# Patient Record
Sex: Female | Born: 1995 | Race: Black or African American | Hispanic: No | Marital: Single | Smoking: Never smoker
Health system: Southern US, Community
[De-identification: ages and names within clinical notes are randomized; demographics above are authoritative.]

## PROBLEM LIST (undated history)

## (undated) DIAGNOSIS — T7840XA Allergy, unspecified, initial encounter: Secondary | ICD-10-CM

## (undated) DIAGNOSIS — F32A Depression, unspecified: Secondary | ICD-10-CM

## (undated) DIAGNOSIS — F329 Major depressive disorder, single episode, unspecified: Secondary | ICD-10-CM

## (undated) DIAGNOSIS — M797 Fibromyalgia: Secondary | ICD-10-CM

## (undated) DIAGNOSIS — E282 Polycystic ovarian syndrome: Secondary | ICD-10-CM

## (undated) DIAGNOSIS — D8989 Other specified disorders involving the immune mechanism, not elsewhere classified: Secondary | ICD-10-CM

## (undated) DIAGNOSIS — F419 Anxiety disorder, unspecified: Secondary | ICD-10-CM

## (undated) HISTORY — PX: WISDOM TOOTH EXTRACTION: SHX21

## (undated) HISTORY — DX: Depression, unspecified: F32.A

## (undated) HISTORY — DX: Anxiety disorder, unspecified: F41.9

## (undated) HISTORY — DX: Major depressive disorder, single episode, unspecified: F32.9

## (undated) HISTORY — DX: Allergy, unspecified, initial encounter: T78.40XA

## (undated) HISTORY — DX: Fibromyalgia: M79.7

## (undated) MED ORDER — OZEMPIC (0.25 OR 0.5 MG/DOSE) 2 MG/3ML SC SOPN
0.2500 mg | PEN_INJECTOR | SUBCUTANEOUS | 0 refills | Status: AC
Start: 2023-09-02 — End: ?

---
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
## 2016-08-11 ENCOUNTER — Ambulatory Visit (INDEPENDENT_AMBULATORY_CARE_PROVIDER_SITE_OTHER): Payer: BLUE CROSS/BLUE SHIELD | Admitting: Family Medicine

## 2016-08-11 VITALS — BP 138/82 | HR 95 | Temp 98.1°F | Resp 16 | Ht 64.0 in | Wt 173.0 lb

## 2016-08-11 DIAGNOSIS — J069 Acute upper respiratory infection, unspecified: Secondary | ICD-10-CM

## 2016-08-11 MED ORDER — MOMETASONE FUROATE 50 MCG/ACT NA SUSP: 2.0000 | Freq: Every day | NASAL | 12 refills | Status: DC

## 2016-08-11 NOTE — Patient Instructions (Addendum)
     IF you received an x-ray today, you will receive an invoice from [facility]. Please contact [facility] at [phone] with questions or concerns regarding your invoice.   IF you received labwork today, you will receive an invoice from [facility]. Please contact [facility] at [phone] with questions or concerns regarding your invoice.   Our billing staff will not be able to assist you with questions regarding bills from these companies.  You will be contacted with the lab results as soon as they are available. The fastest way to get your results is to activate your My Chart account. Instructions are located on the last page of this paperwork. If you have not heard from us regarding the results in 2 weeks, please contact this office.      Viral Infections A virus is a type of germ. Viruses can cause:  Minor sore throats.  Aches and pains.  Headaches.  Runny nose.  Rashes.  Watery eyes.  Tiredness.  Coughs.  Loss of appetite.  Feeling sick to your stomach (nausea).  Throwing up (vomiting).  Watery poop (diarrhea). HOME CARE   Only take medicines as told by your doctor.  Drink enough water and fluids to keep your pee (urine) clear or pale yellow. Sports drinks are a good choice.  Get plenty of rest and eat healthy. Soups and broths with crackers or rice are fine. GET HELP RIGHT AWAY IF:   You have a very bad headache.  You have shortness of breath.  You have chest pain or neck pain.  You have an unusual rash.  You cannot stop throwing up.  You have watery poop that does not stop.  You cannot keep fluids down.  You or your child has a temperature by mouth above 102 F (38.9 C), not controlled by medicine.  Your baby is older than 3 months with a rectal temperature of 102 F (38.9 C) or higher.  Your baby is 253 months old or younger with a rectal temperature of 100.4 F (38 C) or higher. MAKE SURE  YOU:   Understand these instructions.  Will watch this condition.  Will get help right away if you are not doing well or get worse.   This information is not intended to replace advice given to you by your health care provider. Make sure you discuss any questions you have with your health care provider.   Document Released: 10/01/2008 Document Revised: 01/11/2012 Document Reviewed: 03/27/2015 Elsevier Interactive Patient Education [employer]2016 Elsevier Inc.

## 2016-08-11 NOTE — Progress Notes (Signed)
  Chief Complaint  Patient presents with  . Sore Throat    x 2 days   . Generalized Body Aches    HPI  Upper Respiratory Infection: Patient complains of symptoms of a URI. Symptoms include congestion, cough and sore throat. Onset of symptoms was 3 days ago, gradually worsening since that time. She also c/o achiness, post nasal drip and productive cough with  yellow colored sputum for the past 3 days .  She is not drinking much fluids. Evaluation to date: none. Treatment to date: none.  No history of asthma Non smoker    Past Medical History:  Diagnosis Date  . Allergy   . Depression     Current Outpatient Prescriptions  Medication Sig Dispense Refill  . DULoxetine HCl (CYMBALTA PO) Take by mouth.    . METFORMIN HCL PO Take by mouth.    . mometasone (NASONEX) 50 MCG/ACT nasal spray Place 2 sprays into the nose daily. 17 g 12   No current facility-administered medications for this visit.     Allergies: No Known Allergies  Past Surgical History:  Procedure Laterality Date  . tonsils and adnoids      Social History   Social History  . Marital status: Single    Spouse name: N/A  . Number of children: N/A  . Years of education: N/A   Social History Main Topics  . Smoking status: Never Smoker  . Smokeless tobacco: Never Used  . Alcohol use Yes  . Drug use: No  . Sexual activity: Not Asked   Other Topics Concern  . None   Social History Narrative  . None    ROS  Objective: Vitals:   08/11/16 1121 08/11/16 1154  BP: 112/80 138/82  Pulse: 95   Resp: 16   Temp: 98.1 F (36.7 C) 98.1 F (36.7 C)  TempSrc: Oral Oral  SpO2: 97%   Weight: 173 lb (78.5 kg)   Height: 5\' 4"  (1.626 m)     Physical Exam  General: alert, oriented, in NAD Head: normocephalic, atraumatic, no sinus tenderness Eyes: EOM intact, no scleral icterus or conjunctival injection Ears: TM clear bilaterally Throat: no pharyngeal exudate or erythema Lymph: no posterior auricular,  submental or cervical lymph adenopathy Heart: normal rate, normal sinus rhythm, no murmurs Lungs: clear to auscultation bilaterally, no wheezing   Assessment and Plan Monia was seen today for sore throat and generalized body aches.  Diagnoses and all orders for this visit:  Acute upper respiratory infection  Discussed viral vs. Bacterial infection Antibiotics not warranted at this time Supportive care Increase fluid hydration rtc if fever or worsening symptoms -     Discontinue: mometasone (NASONEX) 50 MCG/ACT nasal spray; Place 2 sprays into the nose daily. -     mometasone (NASONEX) 50 MCG/ACT nasal spray; Place 2 sprays into the nose daily.     Karrington Studnicka A Diarra Kos

## 2016-08-13 ENCOUNTER — Ambulatory Visit: Payer: BLUE CROSS/BLUE SHIELD

## 2016-08-14 ENCOUNTER — Ambulatory Visit (INDEPENDENT_AMBULATORY_CARE_PROVIDER_SITE_OTHER): Payer: BLUE CROSS/BLUE SHIELD

## 2016-08-14 ENCOUNTER — Ambulatory Visit (INDEPENDENT_AMBULATORY_CARE_PROVIDER_SITE_OTHER): Payer: BLUE CROSS/BLUE SHIELD | Admitting: Physician Assistant

## 2016-08-14 VITALS — BP 116/80 | HR 93 | Temp 97.4°F | Resp 16 | Ht 65.0 in | Wt 173.0 lb

## 2016-08-14 DIAGNOSIS — R05 Cough: Secondary | ICD-10-CM

## 2016-08-14 DIAGNOSIS — E282 Polycystic ovarian syndrome: Secondary | ICD-10-CM | POA: Insufficient documentation

## 2016-08-14 DIAGNOSIS — R059 Cough, unspecified: Secondary | ICD-10-CM

## 2016-08-14 DIAGNOSIS — R768 Other specified abnormal immunological findings in serum: Secondary | ICD-10-CM | POA: Insufficient documentation

## 2016-08-14 DIAGNOSIS — M797 Fibromyalgia: Secondary | ICD-10-CM | POA: Insufficient documentation

## 2016-08-14 LAB — POCT CBC
GRANULOCYTE PERCENT: 44.1 % (ref 37–80)
HEMATOCRIT: 38.9 % (ref 37.7–47.9)
Hemoglobin: 13.3 g/dL (ref 12.2–16.2)
Lymph, poc: 1.7 (ref 0.6–3.4)
MCH: 28.8 pg (ref 27–31.2)
MCHC: 34.1 g/dL (ref 31.8–35.4)
MCV: 84.4 fL (ref 80–97)
MID (CBC): 0.6 (ref 0–0.9)
MPV: 7.1 fL (ref 0–99.8)
PLATELET COUNT, POC: 264 10*3/uL (ref 142–424)
POC GRANULOCYTE: 1.9 — AB (ref 2–6.9)
POC LYMPH PERCENT: 40.7 %L (ref 10–50)
POC MID %: 15.2 % — AB (ref 0–12)
RBC: 4.61 M/uL (ref 4.04–5.48)
RDW, POC: 13.8 %
WBC: 4.2 10*3/uL — AB (ref 4.6–10.2)

## 2016-08-14 MED ORDER — BENZONATATE 100 MG PO CAPS: 100.0000 mg | ORAL_CAPSULE | Freq: Three times a day (TID) | ORAL | 0 refills | Status: DC | PRN

## 2016-08-14 MED ORDER — GUAIFENESIN ER 1200 MG PO TB12: 1.0000 | ORAL_TABLET | Freq: Two times a day (BID) | ORAL | 1 refills | Status: DC | PRN

## 2016-08-14 NOTE — Patient Instructions (Addendum)
You currently have a VIRAL illness. Please continue the nasal spray that Dr. Creta LevinStallings prescribed. Add the cough suppressant and the Mucinex that I sent to the pharmacy. Get LOTS of rest and LOTS of water to drink. Use ibuprofen and or acetaminophen for the body aches. We expect your symptoms to begin to improve in the next 48-72 hours. If they do not begin to improve, or if they worsen, please return for re-evaluation.    IF you received an x-ray today, you will receive an invoice from [facility]. Please contact [facility] at [phone] with questions or concerns regarding your invoice.   IF you received labwork today, you will receive an invoice from [facility]. Please contact [facility] at [phone] with questions or concerns regarding your invoice.   Our billing staff will not be able to assist you with questions regarding bills from these companies.  You will be contacted with the lab results as soon as they are available. The fastest way to get your results is to activate your My Chart account. Instructions are located on the last page of this paperwork. If you have not heard from us regarding the results in 2 weeks, please contact this office.

## 2016-08-14 NOTE — Progress Notes (Signed)
Patient ID: Catherine Hurley, female    DOB: 1996/11/02, 20 y.o.   MRN: [id number]  PCP: No PCP Per Patient  Subjective:   Chief Complaint  Patient presents with  . Follow-up    URI    HPI Presents for evaluation of persistent illness.  She was seen here on 08/11/2016 with cough, congestion and sore throat. She was diagnosed with a viral URI and prescribed supportive care.  Since her visit 10/10, she has developed chest and back pain with breathing and cough. Cough is productive of "deep yellow" colored sputum. No facial pain. Body aches. Feels cold, "and I don't get cold."   Review of Systems As above.    Patient Active Problem List   Diagnosis Date Noted  . PCOS (polycystic ovarian syndrome) 08/14/2016  . Sjogren's syndrome (HCC) 08/14/2016  . Fibromyalgia 08/14/2016  . Lupus 08/14/2016     Prior to Admission medications   Medication Sig Start Date End Date Taking? Authorizing Provider  DULoxetine HCl (CYMBALTA PO) Take by mouth.   Yes Historical Provider, MD  METFORMIN HCL PO Take by mouth.   Yes Historical Provider, MD  mometasone (NASONEX) 50 MCG/ACT nasal spray Place 2 sprays into the nose daily. 08/11/16  Yes Zoe Camelia Phenes, MD  Norethin-Eth Estradiol-Fe (GENERESS FE PO) Take by mouth.   Yes Historical Provider, MD     No Known Allergies     Objective:  Physical Exam  Constitutional: She is oriented to person, place, and time. She appears well-developed and well-nourished. She is active and cooperative. No distress.  BP 116/80 (BP Location: Right Arm, Patient Position: Sitting, Cuff Size: Normal)   Pulse 93   Temp 97.4 F (36.3 C) (Oral)   Resp 16   Ht 5\' 5"  (1.651 m)   Wt 173 lb (78.5 kg)   LMP 07/30/2016 (Approximate)   SpO2 97%   BMI 28.79 kg/m   HENT:  Head: Normocephalic and atraumatic.  Right Ear: Hearing, tympanic membrane, external ear and ear canal normal.  Left Ear: Hearing, tympanic membrane, external ear and ear canal normal.  Nose:  Mucosal edema (mild) present. No rhinorrhea.  Mouth/Throat: Uvula is midline, oropharynx is clear and moist and mucous membranes are normal. No oral lesions.  Eyes: Conjunctivae are normal. No scleral icterus.  Neck: Normal range of motion. Neck supple. No thyromegaly present.  Cardiovascular: Normal rate, regular rhythm and normal heart sounds.   Pulses:      Radial pulses are 2+ on the right side, and 2+ on the left side.  Pulmonary/Chest: Effort normal and breath sounds normal.  Lymphadenopathy:       Head (right side): No tonsillar, no preauricular, no posterior auricular and no occipital adenopathy present.       Head (left side): No tonsillar, no preauricular, no posterior auricular and no occipital adenopathy present.    She has no cervical adenopathy.       Right: No supraclavicular adenopathy present.       Left: No supraclavicular adenopathy present.  Neurological: She is alert and oriented to person, place, and time. No sensory deficit.  Skin: Skin is warm, dry and intact. No rash noted. No cyanosis or erythema. Nails show no clubbing.  Psychiatric: She has a normal mood and affect. Her speech is normal and behavior is normal.       Results for orders placed or performed in visit on 08/14/16  POCT CBC  Result Value Ref Range   WBC 4.2 (A) 4.6 -  10.2 K/uL   Lymph, poc 1.7 0.6 - 3.4   POC LYMPH PERCENT 40.7 10 - 50 %L   MID (cbc) 0.6 0 - 0.9   POC MID % 15.2 (A) 0 - 12 %M   POC Granulocyte 1.9 (A) 2 - 6.9   Granulocyte percent 44.1 37 - 80 %G   RBC 4.61 4.04 - 5.48 M/uL   Hemoglobin 13.3 12.2 - 16.2 g/dL   HCT, POC 74.238.9 59.537.7 - 47.9 %   MCV 84.4 80 - 97 fL   MCH, POC 28.8 27 - 31.2 pg   MCHC 34.1 31.8 - 35.4 g/dL   RDW, POC 63.813.8 %   Platelet Count, POC 264 142 - 424 K/uL   MPV 7.1 0 - 99.8 fL    Dg Chest 2 View  Result Date: 08/14/2016 CLINICAL DATA:  Productive cough and chills.  Initial encounter. EXAM: CHEST  2 VIEW COMPARISON:  None. FINDINGS: Lungs are  clear. Heart size is normal. No pneumothorax or pleural fluid. No bony abnormality. IMPRESSION: Negative chest. Electronically Signed   By: Drusilla Kannerhomas  Dalessio M.D.   On: 08/14/2016 11:07       Assessment & Plan:   1. Cough Reassuring CXR and CBC. Continue supportive care. Anticipatory guidance provided. - POCT CBC - DG Chest 2 View; Future - Guaifenesin (MUCINEX MAXIMUM STRENGTH) 1200 MG TB12; Take 1 tablet (1,200 mg total) by mouth every 12 (twelve) hours as needed.  Dispense: 14 tablet; Refill: 1 - benzonatate (TESSALON) 100 MG capsule; Take 1-2 capsules (100-200 mg total) by mouth 3 (three) times daily as needed for cough.  Dispense: 40 capsule; Refill: 0   Fernande Brashelle S. Evelisse Szalkowski, PA-C Physician Assistant-Certified [facility]

## 2016-08-17 ENCOUNTER — Telehealth: Payer: Self-pay

## 2016-08-17 NOTE — Telephone Encounter (Signed)
Pt requesting oow note for today,she willl return to work On Tuesday,    Best phone for pt is [phone]

## 2016-08-18 ENCOUNTER — Encounter: Payer: Self-pay | Admitting: *Deleted

## 2016-08-18 NOTE — Telephone Encounter (Signed)
Note printed and given to patient.  

## 2016-10-06 ENCOUNTER — Emergency Department (HOSPITAL_COMMUNITY)
Admission: EM | Admit: 2016-10-06 | Discharge: 2016-10-06 | Disposition: A | Discharge status: Home / Self Care | Payer: BLUE CROSS/BLUE SHIELD | Attending: Emergency Medicine | Admitting: Emergency Medicine

## 2016-10-06 ENCOUNTER — Encounter (HOSPITAL_COMMUNITY): Payer: Self-pay | Admitting: *Deleted

## 2016-10-06 DIAGNOSIS — K047 Periapical abscess without sinus: Secondary | ICD-10-CM | POA: Insufficient documentation

## 2016-10-06 DIAGNOSIS — K029 Dental caries, unspecified: Secondary | ICD-10-CM | POA: Insufficient documentation

## 2016-10-06 DIAGNOSIS — K0889 Other specified disorders of teeth and supporting structures: Secondary | ICD-10-CM | POA: Diagnosis present

## 2016-10-06 HISTORY — DX: Polycystic ovarian syndrome: E28.2

## 2016-10-06 HISTORY — DX: Other specified disorders involving the immune mechanism, not elsewhere classified: D89.89

## 2016-10-06 MED ORDER — AMOXICILLIN 500 MG PO CAPS
500.0000 mg | ORAL_CAPSULE | Freq: Once | ORAL | Status: AC
  Administered 2016-10-06: 500 mg via ORAL
  Filled 2016-10-06: qty 1

## 2016-10-06 MED ORDER — AMOXICILLIN 500 MG PO CAPS: 500.0000 mg | ORAL_CAPSULE | Freq: Three times a day (TID) | ORAL | 0 refills | Status: DC

## 2016-10-06 MED ORDER — TRAMADOL HCL 50 MG PO TABS: 50.0000 mg | ORAL_TABLET | Freq: Four times a day (QID) | ORAL | 0 refills | Status: DC | PRN

## 2016-10-06 NOTE — ED Triage Notes (Signed)
Pt c/o R sided facial and dental pain for a few weeks. Reports picking at tooth which has caused swelling to mouth and increased pain. Pt has not been able to see a dentist yet because her insurance in based out of [place] and she could not find a dentist who could see her.

## 2016-10-06 NOTE — ED Provider Notes (Signed)
[facility]-EMERGENCY DEPT Provider Note   CSN: [id number] Arrival date & time: 10/06/16  [id number]  History   Chief Complaint Chief Complaint  Patient presents with  . Dental Pain    HPI Catherine Hurley is a 20 y.o. female.  HPI   Patient with PMH of allergy, fibromyalgia, positive ANA, fibromyalgia and PCOS comes to the ER complaining of right sided dental pain and facial pain for the past few weeks. She has been picking at her tooth which has caused swelling to mouth and increased pain. She hasn't been able to see a dentist because her insurance is in [place] and she has been unable to find a dentist who will see her. She has not had nay fever, N/V/D. She has not had neck pain, headache, lethargy. She is able to swallow.   Past Medical History:  Diagnosis Date  . Allergy   . Autoimmune disorder (HCC)   . Depression   . PCOS (polycystic ovarian syndrome)     Patient Active Problem List   Diagnosis Date Noted  . PCOS (polycystic ovarian syndrome) 08/14/2016  . Fibromyalgia 08/14/2016  . Positive ANA (antinuclear antibody) 08/14/2016    Past Surgical History:  Procedure Laterality Date  . tonsils and adnoids      OB History    No data available       Home Medications    Prior to Admission medications   Medication Sig Start Date End Date Taking? Authorizing Provider  benzonatate (TESSALON) 100 MG capsule Take 1-2 capsules (100-200 mg total) by mouth 3 (three) times daily as needed for cough. 08/14/16   Chelle Jeffery, PA-C  DULoxetine HCl (CYMBALTA PO) Take by mouth.    Historical Provider, MD  Guaifenesin (MUCINEX MAXIMUM STRENGTH) 1200 MG TB12 Take 1 tablet (1,200 mg total) by mouth every 12 (twelve) hours as needed. 08/14/16   Chelle Jeffery, PA-C  METFORMIN HCL PO Take by mouth.    Historical Provider, MD  mometasone (NASONEX) 50 MCG/ACT nasal spray Place 2 sprays into the nose daily. 08/11/16   Doristine BosworthZoe A Stallings, MD  Norethin-Eth Estradiol-Fe (GENERESS FE PO) Take by mouth.     Historical Provider, MD    Family History Family History  Problem Relation Age of Onset  . Diabetes Mother   . Hypertension Mother   . Diabetes Father   . Mental illness Father   . Diabetes Maternal Grandmother   . Hyperlipidemia Maternal Grandmother   . Hypertension Maternal Grandmother   . Stroke Maternal Grandmother   . Cancer Paternal Grandmother   . Stroke Paternal Grandmother     Social History Social History  Substance Use Topics  . Smoking status: Never Smoker  . Smokeless tobacco: Never Used  . Alcohol use Yes     Allergies   Patient has no known allergies.   Review of Systems Review of Systems Review of Systems All other systems negative except as documented in the HPI. All pertinent positives and negatives as reviewed in the HPI.   Physical Exam Updated Vital Signs BP 143/95   Pulse 95   Temp 97.9 F (36.6 C) (Oral)   Resp 16   LMP 09/17/2016   SpO2 99%   Physical Exam  Constitutional: She appears well-developed and well-nourished. No distress.  HENT:  Head: Normocephalic and atraumatic.  Mouth/Throat: Uvula is midline, oropharynx is clear and moist and mucous membranes are normal. Normal dentition. Dental caries (Pts tooth shows no obvious abscess but moderate to severe tenderness to palpation of marked tooth)  present. No uvula swelling.  Eyes: Pupils are equal, round, and reactive to light.  Neck: Trachea normal, normal range of motion and full passive range of motion without pain. Neck supple.  Cardiovascular: Normal rate, regular rhythm, normal heart sounds and normal pulses.   Pulmonary/Chest: Effort normal and breath sounds normal. No respiratory distress. Chest wall is not dull to percussion. She exhibits no tenderness, no crepitus, no edema, no deformity and no retraction.  Abdominal: Normal appearance.  Musculoskeletal: Normal range of motion.  Neurological: She is alert. She has normal strength.  Skin: Skin is warm, dry and intact. She  is not diaphoretic.  Psychiatric: She has a normal mood and affect. Her speech is normal. Cognition and memory are normal.     ED Treatments / Results  Labs (all labs ordered are listed, but only abnormal results are displayed) Labs Reviewed - No data to display  EKG  EKG Interpretation None       Radiology No results found.  Procedures Procedures (including critical care time)  Medications Ordered in ED Medications  amoxicillin (AMOXIL) capsule 500 mg (not administered)     Initial Impression / Assessment and Plan / ED Course  I have reviewed the triage vital signs and the nursing notes.  Pertinent labs & imaging results that were available during my care of the patient were reviewed by me and considered in my medical decision making (see chart for details).  Clinical Course     Medications  amoxicillin (AMOXIL) capsule 500 mg (not administered)    20 y.o.Catherine Hurley's evaluation in the Emergency Department is complete.  We have discussed signs and symptoms that warrant return to the ED, such as changes or worsening in symptoms. No emergent s/sx's present. Patent airway. No trismus.  No neck tenderness or protrusion of tongue or floor of mouth. Patient will be given an rx for Amoxicillin and Ultram. He will be referred to a dentist with instructions for follow-up.  Vital signs are stable at discharge. Vitals:   10/06/16 0415 10/06/16 0455  BP: 145/93 143/95  Pulse: 95   Resp: 16   Temp: 97.9 F (36.6 C)     Patient/guardian has voiced understanding and agreed to follow-up with the PCP or specialist.   Final Clinical Impressions(s) / ED Diagnoses   Final diagnoses:  Dental infection    New Prescriptions New Prescriptions   No medications on file     Marlon Peliffany Annalysa Mohammad, PA-C 10/06/16 0544    Layla MawKristen N Ward, DO 10/06/16 [id number]

## 2016-10-08 ENCOUNTER — Encounter (HOSPITAL_COMMUNITY): Payer: Self-pay | Admitting: Emergency Medicine

## 2016-10-08 ENCOUNTER — Emergency Department (HOSPITAL_COMMUNITY)
Admission: EM | Admit: 2016-10-08 | Discharge: 2016-10-08 | Disposition: A | Discharge status: Home / Self Care | Payer: BLUE CROSS/BLUE SHIELD | Attending: Emergency Medicine | Admitting: Emergency Medicine

## 2016-10-08 DIAGNOSIS — R51 Headache: Secondary | ICD-10-CM

## 2016-10-08 DIAGNOSIS — R519 Headache, unspecified: Secondary | ICD-10-CM

## 2016-10-08 DIAGNOSIS — J329 Chronic sinusitis, unspecified: Secondary | ICD-10-CM | POA: Diagnosis not present

## 2016-10-08 DIAGNOSIS — Z7984 Long term (current) use of oral hypoglycemic drugs: Secondary | ICD-10-CM | POA: Diagnosis not present

## 2016-10-08 LAB — BASIC METABOLIC PANEL
ANION GAP: 7 (ref 5–15)
BUN: 9 mg/dL (ref 6–20)
CO2: 28 mmol/L (ref 22–32)
Calcium: 9.6 mg/dL (ref 8.9–10.3)
Chloride: 104 mmol/L (ref 101–111)
Creatinine, Ser: 0.79 mg/dL (ref 0.44–1.00)
GFR calc Af Amer: >60 mL/min (ref >60)
Glucose, Bld: 113 mg/dL — ABNORMAL HIGH (ref 65–99)
POTASSIUM: 3.7 mmol/L (ref 3.5–5.1)
SODIUM: 139 mmol/L (ref 135–145)

## 2016-10-08 LAB — CBC WITH DIFFERENTIAL/PLATELET
BASOS ABS: 0 10*3/uL (ref 0.0–0.1)
Basophils Relative: 0 %
EOS ABS: 0.1 10*3/uL (ref 0.0–0.7)
EOS PCT: 1 %
HCT: 39.8 % (ref 36.0–46.0)
Hemoglobin: 12.9 g/dL (ref 12.0–15.0)
LYMPHS PCT: 43 %
Lymphs Abs: 3 10*3/uL (ref 0.7–4.0)
MCH: 28.4 pg (ref 26.0–34.0)
MCHC: 32.4 g/dL (ref 30.0–36.0)
MCV: 87.7 fL (ref 78.0–100.0)
Monocytes Absolute: 0.6 10*3/uL (ref 0.1–1.0)
Monocytes Relative: 9 %
NEUTROS PCT: 47 %
Neutro Abs: 3.2 10*3/uL (ref 1.7–7.7)
PLATELETS: 314 10*3/uL (ref 150–400)
RBC: 4.54 MIL/uL (ref 3.87–5.11)
RDW: 13.7 % (ref 11.5–15.5)
WBC: 6.9 10*3/uL (ref 4.0–10.5)

## 2016-10-08 LAB — I-STAT BETA HCG BLOOD, ED (MC, WL, AP ONLY)

## 2016-10-08 MED ORDER — DIPHENHYDRAMINE HCL 50 MG/ML IJ SOLN
12.5000 mg | Freq: Once | INTRAMUSCULAR | Status: DC
  Filled 2016-10-08: qty 1

## 2016-10-08 MED ORDER — METOCLOPRAMIDE HCL 5 MG/ML IJ SOLN
10.0000 mg | Freq: Once | INTRAMUSCULAR | Status: DC
  Filled 2016-10-08: qty 2

## 2016-10-08 MED ORDER — FLUTICASONE PROPIONATE 50 MCG/ACT NA SUSP: 1.0000 | Freq: Every day | NASAL | 0 refills | Status: DC

## 2016-10-08 MED ORDER — IBUPROFEN 400 MG PO TABS
600.0000 mg | ORAL_TABLET | Freq: Once | ORAL | Status: AC
  Administered 2016-10-08: 600 mg via ORAL
  Filled 2016-10-08: qty 1

## 2016-10-08 NOTE — ED Triage Notes (Signed)
Pt. reports right temporal headache /pressure onset yesterday , denies head injury , no emesis or photophobia , pt. added chronic right eye twitchings for 2 years .

## 2016-10-08 NOTE — ED Provider Notes (Deleted)
20 year old female with a right temporal headache which started yesterday. Headache is similar to prior headaches. She is currently being treated for possible sinusitis with amoxicillin. There is no significant sinus tenderness on my exam. She keeps her right eye closed but this is a voluntary action in that she resists passive opening of her eyelid. She is driving herself and does not wish to receive diphenhydramine. She is discharged home with medications to treat her headache.  Medical screening examination/treatment/procedure(s) were conducted as a shared visit with non-physician practitioner(s) and myself.  I personally evaluated the patient during the encounter.     Dione Boozeavid Laurieann Friddle, MD 10/08/16 [phone]

## 2016-10-08 NOTE — ED Provider Notes (Signed)
[facility]-EMERGENCY DEPT Provider Note   CSN: [id number] Arrival date & time: 10/08/16  0106     History   Chief Complaint Chief Complaint  Patient presents with  . Headache    HPI Catherine Hurley is a 20 y.o. female.  Patient presents with complaint of right temporal headache and right maxillary sinus pressure. No fever, congestion, sore throat. She is currently on antibiotics for a tooth infection and reports the tooth is less painful. She denies visual changes, nausea or vomiting. She has not tried any medications outpatient except the antibiotic. She reports history of headaches in the past without diagnosis of migraine.   The history is provided by the patient. No language interpreter was used.    Past Medical History:  Diagnosis Date  . Allergy   . Autoimmune disorder (HCC)   . Depression   . PCOS (polycystic ovarian syndrome)     Patient Active Problem List   Diagnosis Date Noted  . PCOS (polycystic ovarian syndrome) 08/14/2016  . Fibromyalgia 08/14/2016  . Positive ANA (antinuclear antibody) 08/14/2016    Past Surgical History:  Procedure Laterality Date  . tonsils and adnoids      OB History    No data available       Home Medications    Prior to Admission medications   Medication Sig Start Date End Date Taking? Authorizing Provider  amoxicillin (AMOXIL) 500 MG capsule Take 1 capsule (500 mg total) by mouth 3 (three) times daily. 10/06/16   Tiffany Neva SeatGreene, PA-C  benzonatate (TESSALON) 100 MG capsule Take 1-2 capsules (100-200 mg total) by mouth 3 (three) times daily as needed for cough. 08/14/16   Chelle Jeffery, PA-C  DULoxetine HCl (CYMBALTA PO) Take by mouth.    Historical Provider, MD  Guaifenesin (MUCINEX MAXIMUM STRENGTH) 1200 MG TB12 Take 1 tablet (1,200 mg total) by mouth every 12 (twelve) hours as needed. 08/14/16   Chelle Jeffery, PA-C  METFORMIN HCL PO Take by mouth.    Historical Provider, MD  mometasone (NASONEX) 50 MCG/ACT nasal spray Place 2  sprays into the nose daily. 08/11/16   Doristine BosworthZoe A Stallings, MD  Norethin-Eth Estradiol-Fe (GENERESS FE PO) Take by mouth.    Historical Provider, MD  traMADol (ULTRAM) 50 MG tablet Take 1 tablet (50 mg total) by mouth every 6 (six) hours as needed. 10/06/16   Marlon Peliffany Greene, PA-C    Family History Family History  Problem Relation Age of Onset  . Diabetes Mother   . Hypertension Mother   . Diabetes Father   . Mental illness Father   . Diabetes Maternal Grandmother   . Hyperlipidemia Maternal Grandmother   . Hypertension Maternal Grandmother   . Stroke Maternal Grandmother   . Cancer Paternal Grandmother   . Stroke Paternal Grandmother     Social History Social History  Substance Use Topics  . Smoking status: Never Smoker  . Smokeless tobacco: Never Used  . Alcohol use Yes     Allergies   Patient has no known allergies.   Review of Systems Review of Systems  Constitutional: Negative for chills and fever.  HENT: Positive for sinus pain and sinus pressure. Negative for congestion, ear pain and sore throat.   Eyes: Negative for photophobia.       Complains of right eye twitching for the past 2 years.   Respiratory: Negative.  Negative for cough and shortness of breath.   Cardiovascular: Negative.  Negative for chest pain.  Gastrointestinal: Negative.  Negative for nausea.  Musculoskeletal:  Negative.  Negative for neck stiffness.  Neurological: Positive for headaches. Negative for facial asymmetry.     Physical Exam Updated Vital Signs BP 140/94   Pulse 98   Temp 98.3 F (36.8 C) (Oral)   Resp 16   Ht 5\' 4"  (1.626 m)   Wt 78.9 kg   LMP 09/17/2016   SpO2 100%   BMI 29.87 kg/m   Physical Exam  Constitutional: She is oriented to person, place, and time. She appears well-developed and well-nourished. No distress.  HENT:  Nose: Mucosal edema (Right nasal mucosal edema) present. Right sinus exhibits maxillary sinus tenderness.  Mouth/Throat: Oropharynx is clear and  moist.  Pulmonary/Chest: Effort normal. She has no wheezes. She has no rales.  Abdominal: Soft. There is no tenderness.  Musculoskeletal: Normal range of motion.  Neurological: She is alert and oriented to person, place, and time.  CN's 3-12 are grossly intact. Coordination without deficit. Speech clear and focused. Right eye lid closed asymmetrically, but there is voluntary resistance to testing.  Skin: Skin is warm and dry.  Psychiatric: She has a normal mood and affect.     ED Treatments / Results  Labs (all labs ordered are listed, but only abnormal results are displayed) Labs Reviewed  BASIC METABOLIC PANEL - Abnormal; Notable for the following:       Result Value   Glucose, Bld 113 (*)    All other components within normal limits  CBC WITH DIFFERENTIAL/PLATELET  I-STAT BETA HCG BLOOD, ED ([facility], [facility], [facility] ONLY)    EKG  EKG Interpretation None       Radiology No results found.  Procedures Procedures (including critical care time)  Medications Ordered in ED Medications - No data to display   Initial Impression / Assessment and Plan / ED Course  I have reviewed the triage vital signs and the nursing notes.  Pertinent labs & imaging results that were available during my care of the patient were reviewed by me and considered in my medical decision making (see chart for details).  Clinical Course     Patient with complaint of right sided headache without neurologic deficits. Symptoms follow sinus pattern with swelling in the right nare and distribution of pain. She is taking abx already - doubt bacterial sinusitis. Will provide symptomatic relief and encourage PCP follow up.  Final Clinical Impressions(s) / ED Diagnoses   Final diagnoses:  None   1. Sinusitis 2. Sinus headache New Prescriptions New Prescriptions   No medications on file     Danne HarborShari Topanga Alvelo, PA-C 10/08/16 0603    Dione Boozeavid Glick, MD 10/08/16 2251

## 2016-11-23 ENCOUNTER — Ambulatory Visit (INDEPENDENT_AMBULATORY_CARE_PROVIDER_SITE_OTHER): Payer: BLUE CROSS/BLUE SHIELD | Admitting: Physician Assistant

## 2016-11-23 VITALS — BP 122/74 | HR 110 | Temp 98.5°F | Resp 18 | Ht 64.0 in | Wt 176.0 lb

## 2016-11-23 DIAGNOSIS — R0989 Other specified symptoms and signs involving the circulatory and respiratory systems: Secondary | ICD-10-CM

## 2016-11-23 DIAGNOSIS — Z8709 Personal history of other diseases of the respiratory system: Secondary | ICD-10-CM | POA: Diagnosis not present

## 2016-11-23 DIAGNOSIS — J029 Acute pharyngitis, unspecified: Secondary | ICD-10-CM | POA: Diagnosis not present

## 2016-11-23 DIAGNOSIS — R05 Cough: Secondary | ICD-10-CM

## 2016-11-23 DIAGNOSIS — R059 Cough, unspecified: Secondary | ICD-10-CM

## 2016-11-23 DIAGNOSIS — J09X2 Influenza due to identified novel influenza A virus with other respiratory manifestations: Secondary | ICD-10-CM | POA: Diagnosis not present

## 2016-11-23 LAB — POCT RAPID STREP A (OFFICE): Rapid Strep A Screen: NEGATIVE

## 2016-11-23 LAB — POCT INFLUENZA A/B
Influenza A, POC: POSITIVE — AB
Influenza B, POC: NEGATIVE

## 2016-11-23 MED ORDER — ALBUTEROL SULFATE (2.5 MG/3ML) 0.083% IN NEBU
2.5000 mg | INHALATION_SOLUTION | Freq: Once | RESPIRATORY_TRACT | Status: AC
  Administered 2016-11-23: 2.5 mg via RESPIRATORY_TRACT

## 2016-11-23 MED ORDER — OSELTAMIVIR PHOSPHATE 75 MG PO CAPS
75.0000 mg | ORAL_CAPSULE | Freq: Two times a day (BID) | ORAL | 0 refills | Status: DC
Start: 2016-11-23 — End: 2017-02-11

## 2016-11-23 MED ORDER — IPRATROPIUM BROMIDE 0.02 % IN SOLN
0.5000 mg | Freq: Once | RESPIRATORY_TRACT | Status: AC
  Administered 2016-11-23: 0.5 mg via RESPIRATORY_TRACT

## 2016-11-23 NOTE — Progress Notes (Signed)
11/23/2016 2:05 PM   DOB: 05-19-1996 / MRN: [id number]  SUBJECTIVE:  Catherine Hurley is a 20 y.o. female presenting for cough that started yesterday.  Reports this started with sore throat.  Has muscle aches in her back and neck along with HA. Has not been taking any meds.  Had a low grade fever last night.   She has a history of asthma and has an inhaler and has not felt like she has needed it.    She has No Known Allergies.   She  has a past medical history of Allergy; Autoimmune disorder (HCC); Depression; and PCOS (polycystic ovarian syndrome).    She  reports that she has never smoked. She has never used smokeless tobacco. She reports that she drinks alcohol. She reports that she does not use drugs. She  has no sexual activity history on file. The patient  has a past surgical history that includes tonsils and adnoids.  Her family history includes Cancer in her paternal grandmother; Diabetes in her father, maternal grandmother, and mother; Hyperlipidemia in her maternal grandmother; Hypertension in her maternal grandmother and mother; Mental illness in her father; Stroke in her maternal grandmother and paternal grandmother.  Review of Systems  Constitutional: Positive for chills, diaphoresis and malaise/fatigue. Negative for fever.  Respiratory: Positive for cough. Negative for hemoptysis, sputum production, shortness of breath and wheezing.   Cardiovascular: Negative for chest pain.  Gastrointestinal: Negative for nausea.  Musculoskeletal: Positive for myalgias.  Skin: Negative for rash.  Neurological: Negative for dizziness and weakness.    The problem list and medications were reviewed and updated by myself where necessary and exist elsewhere in the encounter.   OBJECTIVE:  BP 122/74 (BP Location: Right Arm, Patient Position: Sitting, Cuff Size: Small)   Pulse (!) 110   Temp 98.5 F (36.9 C) (Oral)   Resp 18   Ht 5\' 4"  (1.626 m)   Wt 176 lb (79.8 kg)   LMP  (LMP Unknown)  Comment: Patient has PCOS  SpO2 95%   BMI 30.21 kg/m   Physical Exam  Constitutional: She appears well-developed and well-nourished. No distress.  HENT:  Right Ear: Tympanic membrane normal.  Left Ear: Tympanic membrane normal.  Nose: Nose normal.  Mouth/Throat: Uvula is midline, oropharynx is clear and moist and mucous membranes are normal.  Cardiovascular: Normal rate, regular rhythm and normal heart sounds.   Pulmonary/Chest: Effort normal and breath sounds normal. No respiratory distress. She has no wheezes. She has no rales. She exhibits no tenderness.  Musculoskeletal: Normal range of motion.  Skin: She is not diaphoretic.    Results for orders placed or performed in visit on 11/23/16 (from the past 72 hour(s))  POCT Influenza A/B     Status: Abnormal   Collection Time: 11/23/16 12:58 PM  Result Value Ref Range   Influenza A, POC Positive (A) Negative   Influenza B, POC Negative Negative  POCT rapid strep A     Status: Normal   Collection Time: 11/23/16 12:58 PM  Result Value Ref Range   Rapid Strep A Screen Negative Negative    No results found.  ASSESSMENT AND PLAN:  Keirstin was seen today for sinusitis, cough, sore throat and chills.  Diagnoses and all orders for this visit:  Cough:  Strict return precautions per AVS.  Tamiflu now. See problem 4.  -     POCT Influenza A/B  Influenza due to identified novel influenza A virus with other respiratory manifestations -  POCT Influenza A/B       -     oseltamivir (TAMIFLU) 75 MG capsule; Take 1 capsule (75 mg total) by mouth 2 (two)            times daily.  Abnormally low peak expiratory flow rate: 350 to 450 status post neb.  No wheezing or SOB at any time.  Advised albuterol inhaler for cough q 4-6 hours in the coming days.  Low threshold for steroids if she is not improving.  -     albuterol (PROVENTIL) (2.5 MG/3ML) 0.083% nebulizer solution 2.5 mg; Take 3 mLs (2.5 mg total) by nebulization once. -      ipratropium (ATROVENT) nebulizer solution 0.5 mg; Take 2.5 mLs (0.5 mg total) by nebulization once.  History of asthma: See problem 3.   Sore throat -     POCT rapid strep A       The patient is advised to call or return to clinic if she does not see an improvement in symptoms, or to seek the care of the closest emergency department if she worsens with the above plan.   Deliah BostonMichael Yohannes Waibel, MHS, PA-C [facility] 11/23/2016 2:05 PM

## 2016-11-23 NOTE — Patient Instructions (Addendum)
Take your first dose of tamiflu as soon as possible. Take your next dose tonight.    Drink PLENTY of fluids.   Use your albuterol every 4-6 hours as needed for cough.  Come back for a recheck in 24-48 hours.  If at any time you develop difficulty breathing then please go straight to the ED.     IF you received an x-ray today, you will receive an invoice from [facility]. Please contact [facility] at [phone] with questions or concerns regarding your invoice.   IF you received labwork today, you will receive an invoice from [facility]. Please contact [facility] at [phone] with questions or concerns regarding your invoice.   Our billing staff will not be able to assist you with questions regarding bills from these companies.  You will be contacted with the lab results as soon as they are available. The fastest way to get your results is to activate your My Chart account. Instructions are located on the last page of this paperwork. If you have not heard from us regarding the results in 2 weeks, please contact this office.

## 2016-11-24 ENCOUNTER — Ambulatory Visit: Payer: BLUE CROSS/BLUE SHIELD

## 2016-11-25 ENCOUNTER — Encounter: Payer: Self-pay | Admitting: Emergency Medicine

## 2016-11-26 ENCOUNTER — Ambulatory Visit (INDEPENDENT_AMBULATORY_CARE_PROVIDER_SITE_OTHER): Payer: BLUE CROSS/BLUE SHIELD | Admitting: Family Medicine

## 2016-11-26 ENCOUNTER — Encounter: Payer: Self-pay | Admitting: Family Medicine

## 2016-11-26 VITALS — BP 122/80 | HR 87 | Temp 97.9°F | Resp 16 | Ht 64.0 in | Wt 177.0 lb

## 2016-11-26 DIAGNOSIS — R05 Cough: Secondary | ICD-10-CM | POA: Diagnosis not present

## 2016-11-26 DIAGNOSIS — J329 Chronic sinusitis, unspecified: Secondary | ICD-10-CM

## 2016-11-26 DIAGNOSIS — J029 Acute pharyngitis, unspecified: Secondary | ICD-10-CM | POA: Diagnosis not present

## 2016-11-26 DIAGNOSIS — J111 Influenza due to unidentified influenza virus with other respiratory manifestations: Secondary | ICD-10-CM

## 2016-11-26 DIAGNOSIS — R059 Cough, unspecified: Secondary | ICD-10-CM

## 2016-11-26 LAB — POCT RAPID STREP A (OFFICE): RAPID STREP A SCREEN: NEGATIVE

## 2016-11-26 MED ORDER — HYDROCODONE-HOMATROPINE 5-1.5 MG/5ML PO SYRP: 5.0000 mL | ORAL_SOLUTION | ORAL | 0 refills | Status: DC | PRN

## 2016-11-26 MED ORDER — AZITHROMYCIN 250 MG PO TABS: ORAL_TABLET | ORAL | 0 refills | Status: DC

## 2016-11-26 MED ORDER — BENZONATATE 100 MG PO CAPS: 100.0000 mg | ORAL_CAPSULE | Freq: Three times a day (TID) | ORAL | 0 refills | Status: DC | PRN

## 2016-11-26 NOTE — Progress Notes (Signed)
Patient ID: Catherine Hurley, female    DOB: 10/29/1996  Age: 21 y.o. MRN: [id number]  Chief Complaint  Patient presents with  . Sore Throat    X 3 days     Subjective:   Patient was treated several days ago for influenza. She has continued with having a sore throat that is quite sore now. She has some postnasal drainage and nasal drainage. She is coughing some still. She is not as achy as she was. She is not running much fevers now. The throat is big concern today.  She has polycystic ovaries and is on metformin for that. She does not smoke. She is not pregnant. Current allergies, medications, problem list, past/family and social histories reviewed.  She does have a history of testing positive on some lupus test and also having a little neurologic symptom in her face. She moved here from elsewhere, and needs to be plugged in some time with various specialists. I advised her to see one of the primary care providers here at this facility in the near future, bring in whatever records she can, so that they can help refer her to other specialty care as needed.  Objective:  BP 122/80 (BP Location: Right Arm, Patient Position: Sitting, Cuff Size: Normal)   Pulse 87   Temp 97.9 F (36.6 C) (Oral)   Resp 16   Ht 5\' 4"  (1.626 m)   Wt 177 lb (80.3 kg)   LMP  (LMP Unknown) Comment: Patient has PCOS  SpO2 100%   BMI 30.38 kg/m   Throat has erythema coming down into streaks down either side of the throat. No exudate. Neck supple with small nodes. TMs are normal. Chest clear to auscultation. Heart regular without murmurs. She still is coughing.  Assessment & Plan:   Assessment: 1. Sore throat   2. Rhinosinusitis   3. Influenza   4. Cough       Plan: The original diagnosis of influenza is probably correct, but she may have either strep or a sinusitis with postnasal drainage causing the erythema and pain of the throat. Will check a strep  Screen.    Orders Placed This Encounter  Procedures   . Culture, Group A Strep    Order Specific Question:   Source    Answer:   throat  . POCT rapid strep A    Meds ordered this encounter  Medications  . azithromycin (ZITHROMAX) 250 MG tablet    Sig: Take 2 tabs PO x 1 dose, then 1 tab PO QD x 4 days    Dispense:  6 tablet    Refill:  0         Patient Instructions   Drink plenty of fluids and get enough rest  Take azithromycin 2 pills initially, then 1 daily for 4 days  Continue your other medications which were prescribed the other day.  Take the Hycodan cough syrup 1 teaspoon every 4-6 hours if needed for bad cough. However this does sedate you some, and is best taken at nighttime.  Take the Tessalon cough pills one or 2 pills 3 times daily as needed for cough. This is good for daytime cough.  I would expect you to gradually improve over the next 3 or 4 days, but some of the cough and other symptoms may linger a little longer than that even. However return if you're getting abrupt worse at anytime.  Make an appointment with one of the providers here to help you determine who you  need to be referred to for a follow-up of the possible lupus and the neurologic issues.    IF you received an x-ray today, you will receive an invoice from [facility]. Please contact [facility] at [phone] with questions or concerns regarding your invoice.   IF you received labwork today, you will receive an invoice from [facility]. Please contact [facility] at [phone] with questions or concerns regarding your invoice.   Our billing staff will not be able to assist you with questions regarding bills from these companies.  You will be contacted with the lab results as soon as they are available. The fastest way to get your results is to activate your My Chart account. Instructions are located on the last page of this paperwork. If you have not heard from [place] regarding the results in 2 weeks, please contact this office.          Return if symptoms worsen or fail to improve, for Schedule an appointment for a primary care physician/provider here.Marland Kitchen   HOPPER,DAVID, MD 11/26/2016

## 2016-11-26 NOTE — Patient Instructions (Addendum)
Drink plenty of fluids and get enough rest  Take azithromycin 2 pills initially, then 1 daily for 4 days  Continue your other medications which were prescribed the other day.  Take the Hycodan cough syrup 1 teaspoon every 4-6 hours if needed for bad cough. However this does sedate you some, and is best taken at nighttime.  Take the Tessalon cough pills one or 2 pills 3 times daily as needed for cough. This is good for daytime cough.  I would expect you to gradually improve over the next 3 or 4 days, but some of the cough and other symptoms may linger a little longer than that even. However return if you're getting abrupt worse at anytime.  Make an appointment with one of the providers here to help you determine who you need to be referred to for a follow-up of the possible lupus and the neurologic issues.    IF you received an x-ray today, you will receive an invoice from [facility]. Please contact [facility] at [phone] with questions or concerns regarding your invoice.   IF you received labwork today, you will receive an invoice from [facility]. Please contact [facility] at [phone] with questions or concerns regarding your invoice.   Our billing staff will not be able to assist you with questions regarding bills from these companies.  You will be contacted with the lab results as soon as they are available. The fastest way to get your results is to activate your My Chart account. Instructions are located on the last page of this paperwork. If you have not heard from us regarding the results in 2 weeks, please contact this office.

## 2016-11-28 LAB — CULTURE, GROUP A STREP: Strep A Culture: NEGATIVE

## 2017-01-27 ENCOUNTER — Other Ambulatory Visit: Payer: Self-pay | Admitting: Neurology

## 2017-01-27 DIAGNOSIS — G5139 Clonic hemifacial spasm, unspecified: Secondary | ICD-10-CM

## 2017-01-27 DIAGNOSIS — R51 Headache: Principal | ICD-10-CM

## 2017-01-27 DIAGNOSIS — R519 Headache, unspecified: Secondary | ICD-10-CM

## 2017-02-09 ENCOUNTER — Ambulatory Visit
Admission: RE | Admit: 2017-02-09 | Discharge: 2017-02-09 | Disposition: A | Discharge status: Home / Self Care | Payer: BLUE CROSS/BLUE SHIELD | Source: Ambulatory Visit | Attending: Neurology | Admitting: Neurology

## 2017-02-09 DIAGNOSIS — R519 Headache, unspecified: Secondary | ICD-10-CM

## 2017-02-09 DIAGNOSIS — G5139 Clonic hemifacial spasm, unspecified: Secondary | ICD-10-CM

## 2017-02-09 DIAGNOSIS — R51 Headache: Principal | ICD-10-CM

## 2017-02-09 MED ORDER — GADOBENATE DIMEGLUMINE 529 MG/ML IV SOLN
16.0000 mL | Freq: Once | INTRAVENOUS | Status: AC | PRN
  Administered 2017-02-09: 16 mL via INTRAVENOUS

## 2017-02-11 ENCOUNTER — Ambulatory Visit (INDEPENDENT_AMBULATORY_CARE_PROVIDER_SITE_OTHER): Payer: BLUE CROSS/BLUE SHIELD | Admitting: Physician Assistant

## 2017-02-11 VITALS — BP 130/86 | HR 87 | Temp 97.9°F | Resp 17 | Ht 64.0 in | Wt 183.0 lb

## 2017-02-11 DIAGNOSIS — F447 Conversion disorder with mixed symptom presentation: Secondary | ICD-10-CM | POA: Diagnosis not present

## 2017-02-11 DIAGNOSIS — H9201 Otalgia, right ear: Secondary | ICD-10-CM

## 2017-02-11 DIAGNOSIS — K029 Dental caries, unspecified: Secondary | ICD-10-CM

## 2017-02-11 DIAGNOSIS — R768 Other specified abnormal immunological findings in serum: Secondary | ICD-10-CM | POA: Diagnosis not present

## 2017-02-11 MED ORDER — AMOXICILLIN 875 MG PO TABS: 875.0000 mg | ORAL_TABLET | Freq: Two times a day (BID) | ORAL | 0 refills | Status: AC

## 2017-02-11 NOTE — Progress Notes (Signed)
Patient ID: Catherine Hurley, female    DOB: [birth year], 21 y.o.   MRN: [id number]  PCP: No PCP Per Patient  Chief Complaint  Patient presents with  . Ear Fullness    onset 3 days and before that on and off/ has an abscess tooth    Subjective:   Presents for evaluation of RIGHT ear pain and fullness x 3 days.  She has known dental caries, with a cavity on the RIGHT lower jaw, and is arranging to get a crown, but her insurance won't allow it until she has been with the plan for a certain period of time-estimates it will be June.  She also has seasonal allergies, fibromyalgia, functional neurological disorder and PCOS. She has relocated here from the [place] area and needs to establish with local specialists. She has seen me before for acute issues and desires that I be her PCP.    Review of Systems Constitutional: Negative for activity change, appetite change, fatigueand unexpected weight change.  HENT: Negative for congestion, dental problem, ear pain, hearing loss, mouth sores, postnasal drip, rhinorrhea, sneezing, sore throat, tinnitusand trouble swallowing.  Eyes: Negative for photophobia, pain, rednessand visual disturbance.  Respiratory: Negative for cough, chest tightnessand shortness of breath.  Cardiovascular: Negative for chest pain, palpitationsand leg swelling.  Gastrointestinal: Negative for abdominal pain, blood in stool, constipation, diarrhea, nauseaand vomiting.  Genitourinary: Negative for dysuria, frequency, hematuriaand urgency.  Musculoskeletal: Negative for arthralgias, gait problem, myalgiasand neck stiffness.  Skin: Negative for rash.  Neurological: Negative for dizziness, speech difficulty, weakness, light-headedness, numbnessand headaches.  Hematological: Negative for adenopathy.  Psychiatric/Behavioral: Negative for confusionand sleep disturbance. The patient is not nervous/anxious.     Patient Active Problem List   Diagnosis Date Noted    . 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 PCOS (polycystic ovarian syndrome) 08/14/2016  . Fibromyalgia 08/14/2016  . Positive ANA (antinuclear antibody) 08/14/2016     Prior to Admission medications   Medication Sig Start Date End Date Taking? Authorizing Provider  DULoxetine HCl (CYMBALTA PO) Take 30 mg by mouth daily.    Yes Historical Provider, MD  METFORMIN HCL PO Take 500 mg by mouth 3 (three) times daily.    Yes Historical Provider, MD  Norethin-Eth Estradiol-Fe (GENERESS FE PO) Take 1 tablet by mouth daily.    Yes Historical Provider, MD  amoxicillin (AMOXIL) 875 MG tablet Take 1 tablet (875 mg total) by mouth 2 (two) times daily. 02/11/17 02/21/17  Rande Roylance, PA-C     No Known Allergies     Objective:  Physical Exam  Constitutional: She is oriented to person, place, and time. She appears well-developed and well-nourished. She is active and cooperative. No distress.  BP 130/86 (BP Location: Right Arm, Cuff Size: Large)   Pulse 87   Temp 97.9 F (36.6 C) (Oral)   Resp 17   Ht  (1.626 m)   Wt 183 lb (83 kg)   LMP 12/16/2016   SpO2 99%   BMI 31.41 kg/m   HENT:  Head: Normocephalic and atraumatic.  Right Ear: Hearing, tympanic membrane, external ear and ear canal normal.  Left Ear: Hearing, tympanic membrane, external ear and ear canal normal.  Nose: Nose normal. Right sinus exhibits no maxillary sinus tenderness and no frontal sinus tenderness. Left sinus exhibits no maxillary sinus tenderness and no frontal sinus tenderness.  Mouth/Throat: Uvula is midline, oropharynx is clear and moist and mucous membranes are normal. No uvula swelling. No oropharyngeal  exudate.  Asymmetry of the lower palate, with prominence of the RIGHT floor of the mouth along the inner gingiva. Non-tender. Normo-pigmented.   Eyes: Conjunctivae are normal. No scleral icterus.  Neck: Normal range of motion. Neck supple. No thyromegaly present.   Cardiovascular: Normal rate, regular rhythm and normal heart sounds.   Pulses:      Radial pulses are 2+ on the right side, and 2+ on the left side.  Pulmonary/Chest: Effort normal and breath sounds normal.  Lymphadenopathy:       Head (right side): No tonsillar, no preauricular, no posterior auricular and no occipital adenopathy present.       Head (left side): No tonsillar, no preauricular, no posterior auricular and no occipital adenopathy present.    She has no cervical adenopathy.       Right: No supraclavicular adenopathy present.       Left: No supraclavicular adenopathy present.  Neurological: She is alert and oriented to person, place, and time. No sensory deficit.  Skin: Skin is warm, dry and intact. No rash noted. No cyanosis or erythema. Nails show no clubbing.  Psychiatric: She has a normal mood and affect. Her speech is normal and behavior is normal.           Assessment & Plan:   1. Right ear pain 2. Active dental caries While her ear looks normal, this may represent a dental infection with pain radiating to the ear. May also be due to ETD. Resume steroid nasal spray. Start an oral non-sedating antihistamine. Encouraged her to address the dental problems as soon as she can. - amoxicillin (AMOXIL) 875 MG tablet; Take 1 tablet (875 mg total) by mouth 2 (two) times daily.  Dispense: 20 tablet; Refill: 0   3. Positive ANA (antinuclear antibody) - Ambulatory referral to Rheumatology  4. Functional neurological symptom disorder with mixed symptoms - Ambulatory referral to Neurology    Return if symptoms worsen or fail to improve.   Fernande Bras, PA-C [facility]

## 2017-02-11 NOTE — Progress Notes (Signed)
THIS NOTE IS USED FOR EDUCATIONAL PURPOSES ONLY!!!   Name: Prisma Decarlo  DOB: Oct 07, 1996  Age: 21 y.o. Sex: female  CC:  Chief Complaint  Patient presents with  . Ear Fullness    PCP: No PCP Per Patient  HPI: Patient reports today with complains of ear fullness & ear pain x3 days.   Patient reports that the pain was on and off but is not worsening and is constant. Denies ringing in the ears and changes in hearing. Patient denies pain when she pulls on the ear. No trauma to the ear. Denies change in vision. Reports a history of seasonal allergies. Denies ear drainage.   Denies recent illness. Denies fevers chills. Reports chronic HA.   Patient reports a history of needing a root canal but has not been able to get it taken care of because she has to wait 6 months for her new insurance to kick in. Patient reports she is hoping to get her tooth taken care of prior to June of 2018.    ROS:  Constitutional: Negative for activity change, appetite change, fatigue and unexpected weight change.  HENT: Negative for congestion, dental problem, ear pain, hearing loss, mouth sores, postnasal drip, rhinorrhea, sneezing, sore throat, tinnitus and trouble swallowing.   Eyes: Negative for photophobia, pain, redness and visual disturbance.  Respiratory: Negative for cough, chest tightness and shortness of breath.   Cardiovascular: Negative for chest pain, palpitations and leg swelling.  Gastrointestinal: Negative for abdominal pain, blood in stool, constipation, diarrhea, nausea and vomiting.  Genitourinary: Negative for dysuria, frequency, hematuria and urgency.  Musculoskeletal: Negative for arthralgias, gait problem, myalgias and neck stiffness.  Skin: Negative for rash.  Neurological: Negative for dizziness, speech difficulty, weakness, light-headedness, numbness and headaches.  Hematological: Negative for adenopathy.  Psychiatric/Behavioral: Negative for confusion and sleep disturbance. The patient  is not nervous/anxious.    PMH:  Patient Active Problem List   Diagnosis Date Noted  . PCOS (polycystic ovarian syndrome) 08/14/2016  . Fibromyalgia 08/14/2016  . Positive ANA (antinuclear antibody) 08/14/2016    Allergies: No Known Allergies  Medications:  Current Outpatient Prescriptions on File Prior to Visit  Medication Sig Dispense Refill  . azithromycin (ZITHROMAX) 250 MG tablet Take 2 tabs PO x 1 dose, then 1 tab PO QD x 4 days 6 tablet 0  . benzonatate (TESSALON) 100 MG capsule Take 1-2 capsules (100-200 mg total) by mouth 3 (three) times daily as needed for cough. 40 capsule 0  . DULoxetine HCl (CYMBALTA PO) Take 30 mg by mouth daily.     . fluticasone (FLONASE) 50 MCG/ACT nasal spray Place 1 spray into both nostrils daily. 16 g 0  . HYDROcodone-homatropine (HYCODAN) 5-1.5 MG/5ML syrup Take 5 mLs by mouth every 4 (four) hours as needed. 120 mL 0  . ibuprofen (ADVIL,MOTRIN) 800 MG tablet Take 800 mg by mouth every 8 (eight) hours as needed for moderate pain.    Marland Kitchen METFORMIN HCL PO Take 500 mg by mouth 3 (three) times daily.     . mometasone (NASONEX) 50 MCG/ACT nasal spray Place 2 sprays into the nose daily. (Patient taking differently: Place 2 sprays into the nose daily as needed (allergies). ) 17 g 12  . Norethin-Eth Estradiol-Fe (GENERESS FE PO) Take 1 tablet by mouth daily.     Marland Kitchen oseltamivir (TAMIFLU) 75 MG capsule Take 1 capsule (75 mg total) by mouth 2 (two) times daily. 10 capsule 0  . traMADol (ULTRAM) 50 MG tablet Take 1 tablet (50  mg total) by mouth every 6 (six) hours as needed. (Patient taking differently: Take 50 mg by mouth every 6 (six) hours as needed for moderate pain. ) 10 tablet 0   No current facility-administered medications on file prior to visit.     PE:  GS: WDWN female sitting on exam table in NAD.  Vitals: BP (!) 145/88 (BP Location: Right Arm, Patient Position: Sitting, Cuff Size: Normal)   Pulse 87   Temp 97.9 F (36.6 C) (Oral)   Resp 17   Ht   (1.626 m)   Wt 183 lb (83 kg)   SpO2 99%   BMI 31.41 kg/m  HEENT: Normocephalic, atruamatic. PEARRL. No cervical lymphadenopathy. No thyroid nodules, normal size, and equal bilaterally. Ears: Bilateral ears without erythema, TM clear with good coen of light. TM without bulging. Nose: Patent, without erythema or discharge. Mouth/Throat: Moist mucous membranes. No erythema. 4cm and it's longest diameter hard, non-ttp, non-erythematous, non-moveable nodule in the right bottom palate.  Tonsils without erythema or tonsillar exudate.  Cardiovascular: RRR. No S3 or S4. No murmurs, rubs, or gallops. Pulses 2+ and equal bilateral in the upper and lower extremities. No pitting edema. No varicosities, clubbing, or cyanosis.  Pulm: CTA bilaterally. No expiratory muscle use while breathing.  GI: +BS. NTND. No rigidity or guarding. No rebound tenderness.  Neuro: CN 2-12 grossly intact.  Psych: A&O x 4. Mood and affect appropriate for situation.  Skin: Warm and dry. No rashes or excoriations on exposed skin.   A&P:        Respectfully,  Camillia Herter, PA-S2

## 2017-02-11 NOTE — Patient Instructions (Addendum)
Restart the steroid nasal spray. Get at least 64 ounces of water a day. Add an oral antihistamine (like Claritin, Allegra or Zyrtec).    IF you received an x-ray today, you will receive an invoice from [facility]. Please contact [facility] at [phone] with questions or concerns regarding your invoice.   IF you received labwork today, you will receive an invoice from [facility]. Please contact [facility] at [phone] with questions or concerns regarding your invoice.   Our billing staff will not be able to assist you with questions regarding bills from these companies.  You will be contacted with the lab results as soon as they are available. The fastest way to get your results is to activate your My Chart account. Instructions are located on the last page of this paperwork. If you have not heard from [place] regarding the results in 2 weeks, please contact this office.

## 2017-02-16 ENCOUNTER — Encounter: Payer: Self-pay | Admitting: Neurology

## 2017-04-12 NOTE — Progress Notes (Deleted)
   Office Visit Note  Patient: Catherine Hurley             Date of Birth: 05/02/1996           MRN: [id number]             PCP: Patient, No Pcp Per Referring: Porfirio OarJeffery, Chelle, PA-C Visit Date: 04/15/2017 Occupation: @GUAROCC @    Subjective:  No chief complaint on file.   History of Present Illness: Catherine Hurley is a 21 y.o. female ***   Activities of Daily Living:  Patient reports morning stiffness for *** {minute/hour:19697}.   Patient {ACTIONS;DENIES/REPORTS:21021675::"Denies"} nocturnal pain.  Difficulty dressing/grooming: {ACTIONS;DENIES/REPORTS:21021675::"Denies"} Difficulty climbing stairs: {ACTIONS;DENIES/REPORTS:21021675::"Denies"} Difficulty getting out of chair: {ACTIONS;DENIES/REPORTS:21021675::"Denies"} Difficulty using hands for taps, buttons, cutlery, and/or writing: {ACTIONS;DENIES/REPORTS:21021675::"Denies"}   No Rheumatology ROS completed.   PMFS History:  Patient Active Problem List   Diagnosis Date Noted  . 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 PCOS (polycystic ovarian syndrome) 08/14/2016  . Fibromyalgia 08/14/2016  . Positive ANA (antinuclear antibody) 08/14/2016    Past Medical History:  Diagnosis Date  . Allergy   . Autoimmune disorder (HCC)   . Depression   . PCOS (polycystic ovarian syndrome)     Family History  Problem Relation Age of Onset  . Diabetes Mother   . Hypertension Mother   . Diabetes Father   . Mental illness Father   . Diabetes Maternal Grandmother   . Hyperlipidemia Maternal Grandmother   . Hypertension Maternal Grandmother   . Stroke Maternal Grandmother   . Cancer Paternal Grandmother   . Stroke Paternal Grandmother    Past Surgical History:  Procedure Laterality Date  . 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     Objective: Vital Signs: There were no vitals taken for this visit.   Physical Exam    Musculoskeletal Exam: ***  CDAI Exam: No CDAI exam completed.    Investigation: No additional findings.   Imaging: No results found.  Speciality Comments: No specialty comments available.    Procedures:  No procedures performed Allergies: Patient has no known allergies.   Assessment / Plan:     Visit Diagnoses: Positive ANA (antinuclear antibody)  Functional neurological symptom disorder with mixed symptoms  PCOS (polycystic ovarian syndrome)  History of fibromyalgia    Orders: No orders of the defined types were placed in this encounter.  No orders of the defined types were placed in this encounter.   Face-to-face time spent with patient was *** minutes. 50% of time was spent in counseling and coordination of care.  Follow-Up Instructions: No Follow-up on file.   Pollyann SavoyShaili Manaia Samad, MD  Note - This record has been created using Animal nutritionistDragon software.  Chart creation errors have been sought, but may not always  have been located. Such creation errors do not reflect on  the standard of medical care.

## 2017-04-15 ENCOUNTER — Ambulatory Visit: Payer: BLUE CROSS/BLUE SHIELD | Admitting: Rheumatology

## 2017-04-27 ENCOUNTER — Encounter (HOSPITAL_COMMUNITY): Payer: Self-pay | Admitting: Emergency Medicine

## 2017-04-27 ENCOUNTER — Ambulatory Visit (HOSPITAL_COMMUNITY)
Admission: EM | Admit: 2017-04-27 | Discharge: 2017-04-27 | Disposition: A | Discharge status: Home / Self Care | Payer: BLUE CROSS/BLUE SHIELD | Attending: Family Medicine | Admitting: Family Medicine

## 2017-04-27 ENCOUNTER — Ambulatory Visit (INDEPENDENT_AMBULATORY_CARE_PROVIDER_SITE_OTHER): Payer: BLUE CROSS/BLUE SHIELD

## 2017-04-27 ENCOUNTER — Ambulatory Visit (INDEPENDENT_AMBULATORY_CARE_PROVIDER_SITE_OTHER): Payer: BLUE CROSS/BLUE SHIELD | Admitting: Physician Assistant

## 2017-04-27 ENCOUNTER — Ambulatory Visit: Payer: BLUE CROSS/BLUE SHIELD | Admitting: Neurology

## 2017-04-27 DIAGNOSIS — Z79899 Other long term (current) drug therapy: Secondary | ICD-10-CM | POA: Diagnosis not present

## 2017-04-27 DIAGNOSIS — N008 Acute nephritic syndrome with other morphologic changes: Secondary | ICD-10-CM | POA: Diagnosis not present

## 2017-04-27 DIAGNOSIS — R109 Unspecified abdominal pain: Secondary | ICD-10-CM | POA: Diagnosis not present

## 2017-04-27 DIAGNOSIS — N760 Acute vaginitis: Secondary | ICD-10-CM

## 2017-04-27 DIAGNOSIS — Z8249 Family history of ischemic heart disease and other diseases of the circulatory system: Secondary | ICD-10-CM | POA: Insufficient documentation

## 2017-04-27 DIAGNOSIS — R300 Dysuria: Secondary | ICD-10-CM | POA: Insufficient documentation

## 2017-04-27 DIAGNOSIS — F329 Major depressive disorder, single episode, unspecified: Secondary | ICD-10-CM | POA: Insufficient documentation

## 2017-04-27 DIAGNOSIS — E282 Polycystic ovarian syndrome: Secondary | ICD-10-CM | POA: Insufficient documentation

## 2017-04-27 DIAGNOSIS — N059 Unspecified nephritic syndrome with unspecified morphologic changes: Secondary | ICD-10-CM | POA: Diagnosis not present

## 2017-04-27 DIAGNOSIS — B9689 Other specified bacterial agents as the cause of diseases classified elsewhere: Secondary | ICD-10-CM | POA: Diagnosis not present

## 2017-04-27 DIAGNOSIS — M545 Low back pain: Secondary | ICD-10-CM

## 2017-04-27 DIAGNOSIS — Z3202 Encounter for pregnancy test, result negative: Secondary | ICD-10-CM

## 2017-04-27 DIAGNOSIS — M549 Dorsalgia, unspecified: Secondary | ICD-10-CM | POA: Insufficient documentation

## 2017-04-27 DIAGNOSIS — Z823 Family history of stroke: Secondary | ICD-10-CM | POA: Diagnosis not present

## 2017-04-27 DIAGNOSIS — Z833 Family history of diabetes mellitus: Secondary | ICD-10-CM | POA: Diagnosis not present

## 2017-04-27 DIAGNOSIS — Z809 Family history of malignant neoplasm, unspecified: Secondary | ICD-10-CM | POA: Diagnosis not present

## 2017-04-27 LAB — CBC WITH DIFFERENTIAL/PLATELET
Basophils Absolute: 0 10*3/uL (ref 0.0–0.1)
Basophils Relative: 0 %
Eosinophils Absolute: 0.1 10*3/uL (ref 0.0–0.7)
Eosinophils Relative: 1 %
HCT: 42.4 % (ref 36.0–46.0)
HEMOGLOBIN: 13.5 g/dL (ref 12.0–15.0)
LYMPHS ABS: 2.9 10*3/uL (ref 0.7–4.0)
LYMPHS PCT: 40 %
MCH: 28.3 pg (ref 26.0–34.0)
MCHC: 31.8 g/dL (ref 30.0–36.0)
MCV: 88.9 fL (ref 78.0–100.0)
Monocytes Absolute: 0.6 10*3/uL (ref 0.1–1.0)
Monocytes Relative: 8 %
NEUTROS PCT: 51 %
Neutro Abs: 3.7 10*3/uL (ref 1.7–7.7)
Platelets: 324 10*3/uL (ref 150–400)
RBC: 4.77 MIL/uL (ref 3.87–5.11)
RDW: 13.2 % (ref 11.5–15.5)
WBC: 7.3 10*3/uL (ref 4.0–10.5)

## 2017-04-27 LAB — POCT I-STAT, CHEM 8
BUN: 7 mg/dL (ref 6–20)
CALCIUM ION: 1.17 mmol/L (ref 1.15–1.40)
CHLORIDE: 103 mmol/L (ref 101–111)
CREATININE: 0.7 mg/dL (ref 0.44–1.00)
GLUCOSE: 99 mg/dL (ref 65–99)
HCT: 43 % (ref 36.0–46.0)
Hemoglobin: 14.6 g/dL (ref 12.0–15.0)
Potassium: 3.7 mmol/L (ref 3.5–5.1)
Sodium: 140 mmol/L (ref 135–145)
TCO2: 25 mmol/L (ref 0–100)

## 2017-04-27 LAB — POCT URINALYSIS DIP (DEVICE)
Bilirubin Urine: NEGATIVE
GLUCOSE, UA: NEGATIVE mg/dL
KETONES UR: NEGATIVE mg/dL
LEUKOCYTES UA: NEGATIVE
Nitrite: NEGATIVE
PROTEIN: 30 mg/dL — AB
Urobilinogen, UA: 0.2 mg/dL (ref 0.0–1.0)
pH: 5.5 (ref 5.0–8.0)

## 2017-04-27 LAB — POCT PREGNANCY, URINE: Preg Test, Ur: NEGATIVE

## 2017-04-27 MED ORDER — ONDANSETRON 4 MG PO TBDP: 4.0000 mg | ORAL_TABLET | Freq: Three times a day (TID) | ORAL | 0 refills | Status: DC | PRN

## 2017-04-27 MED ORDER — CEFTRIAXONE SODIUM 1 G IJ SOLR
1.0000 g | Freq: Once | INTRAMUSCULAR | Status: AC
  Administered 2017-04-27: 1 g via INTRAMUSCULAR

## 2017-04-27 MED ORDER — CEFTRIAXONE SODIUM 1 G IJ SOLR
INTRAMUSCULAR | Status: AC
  Filled 2017-04-27: qty 10

## 2017-04-27 MED ORDER — LIDOCAINE HCL (PF) 1 % IJ SOLN
INTRAMUSCULAR | Status: AC
  Filled 2017-04-27: qty 2

## 2017-04-27 MED ORDER — FLUCONAZOLE 200 MG PO TABS: ORAL_TABLET | ORAL | 0 refills | Status: DC

## 2017-04-27 MED ORDER — CEPHALEXIN 500 MG PO CAPS: 500.0000 mg | ORAL_CAPSULE | Freq: Four times a day (QID) | ORAL | 0 refills | Status: DC

## 2017-04-27 MED ORDER — METRONIDAZOLE 500 MG PO TABS: 500.0000 mg | ORAL_TABLET | Freq: Two times a day (BID) | ORAL | 0 refills | Status: DC

## 2017-04-27 NOTE — Discharge Instructions (Signed)
For your vaginal discharge, we prescribed metronidazole here in the clinic, take one tablet twice a day for 7 days. Do not drink any alcohol while taking.  Based on your signs, and symptoms, I am concerned about the possibility of a kidney infection, we have given injection of ceftriaxone here, given some Zofran for nausea, and Keflex. Take one tablet 4 times a day for 5 days.  I've also provided a Diflucan. Do not take unless you show signs or symptoms of a yeast infection. If you do, take one tablet wait 3 days and then take the second tablet

## 2017-04-27 NOTE — ED Provider Notes (Signed)
CSN: [id number]     Arrival date & time 04/27/17  1726 History   First MD Initiated Contact with Patient 04/27/17 1742     Chief Complaint  Patient presents with  . Back Hurley  . Dysuria   (Consider location/radiation/quality/duration/timing/severity/associated sxs/prior Treatment) Catherine Hurley is a 22 y.o. female with a past history of PCOS, who presents to the [facility] urgent care with a chief complaint of back Hurley for 2 weeks, discolored urine, frequency, and urgency along with dysuria. Catherine Hurley, does have vaginal discharge described as thin and white. She is sexually active, does not use protection, she is on birth control. She works as a Water quality scientist at [employer], states she has increased her intake of soda/caffiene containing drinks to stay awake and believes this is also causing discomfort.     The history is provided by the patient.  Back Hurley  Associated symptoms: dysuria   Associated symptoms: no abdominal Hurley and no pelvic Hurley   Dysuria  Associated symptoms: flank Hurley and vaginal discharge   Associated symptoms: no abdominal Hurley, no nausea and no vomiting     Past Medical History:  Diagnosis Date  . Allergy   . Autoimmune disorder (HCC)   . Depression   . PCOS (polycystic ovarian syndrome)    Past Surgical History:  Procedure Laterality Date  . tonsils and adnoids     Family History  Problem Relation Age of Onset  . Diabetes Mother   . Hypertension Mother   . Diabetes Father   . Mental illness Father   . Diabetes Maternal Grandmother   . Hyperlipidemia Maternal Grandmother   . Hypertension Maternal Grandmother   . Stroke Maternal Grandmother   . Cancer Paternal Grandmother   . Stroke Paternal Grandmother    Social History  Substance Use Topics  . Smoking status: Never Smoker  . Smokeless tobacco: Never Used  . Alcohol use Yes   OB History    No data available     Review of Systems  Constitutional: Negative.   HENT:  Negative.   Respiratory: Negative.   Cardiovascular: Negative.   Gastrointestinal: Negative for abdominal Hurley, diarrhea, nausea and vomiting.  Genitourinary: Positive for dysuria, flank Hurley, frequency, urgency and vaginal discharge. Negative for menstrual problem, pelvic Hurley, vaginal bleeding and vaginal Hurley.  Musculoskeletal: Positive for back Hurley.  Skin: Negative.   Neurological: Negative.     Allergies  Patient has no known allergies.  Home Medications   Prior to Admission medications   Medication Sig Start Date End Date Taking? Authorizing Provider  Norethin-Eth Estradiol-Fe (GENERESS FE PO) Take 1 tablet by mouth daily.    Yes [provider]  cephALEXin (KEFLEX) 500 MG capsule Take 1 capsule (500 mg total) by mouth 4 (four) times daily. 04/27/17   Dorena Bodo, NP  DULoxetine HCl (CYMBALTA PO) Take 30 mg by mouth daily.     [provider]  fluconazole (DIFLUCAN) 200 MG tablet Take one tablet today, wait 3 days, take the second tablet 04/27/17   Dorena Bodo, NP  METFORMIN HCL PO Take 500 mg by mouth 3 (three) times daily.     [provider]  metroNIDAZOLE (FLAGYL) 500 MG tablet Take 1 tablet (500 mg total) by mouth 2 (two) times daily. 04/27/17   Dorena Bodo, NP  ondansetron (ZOFRAN ODT) 4 MG disintegrating tablet Take 1 tablet (4 mg total) by mouth every 8 (eight) hours as needed for nausea or vomiting. 04/27/17   Loretta Plume,  Lyman BishopLawrence, NP   Meds Ordered and Administered this Visit   Medications  cefTRIAXone (ROCEPHIN) injection 1 g (1 g Intramuscular Given 04/27/17 1838)    BP 130/87 (BP Location: Right Arm)   Pulse 90   Temp 97.9 F (36.6 C) (Oral)   Resp 18   SpO2 100%  No data found.   Physical Exam  Constitutional: She appears well-developed. No distress.  HENT:  Head: Normocephalic.  Right Ear: External ear normal.  Left Ear: External ear normal.  Eyes: Conjunctivae are normal.  Neck: Normal range of motion.   Cardiovascular: Normal rate and regular rhythm.   Pulmonary/Chest: Effort normal and breath sounds normal.  Abdominal: Bowel sounds are normal. There is CVA tenderness (left sided).  Genitourinary:  Genitourinary Comments: Urine cytology obtained  Skin: Skin is warm and dry. Capillary refill takes less than 2 seconds. She is not diaphoretic.  Psychiatric: She has a normal mood and affect. Her behavior is normal.  Nursing note and vitals reviewed.   Urgent Care Course     Procedures (including critical care time)  Labs Review Labs Reviewed  POCT URINALYSIS DIP (DEVICE) - Abnormal; Notable for the following:       Result Value   Hgb urine dipstick MODERATE (*)    Protein, ur 30 (*)    All other components within normal limits  URINE CULTURE  CBC WITH DIFFERENTIAL/PLATELET  POCT PREGNANCY, URINE  POCT I-STAT, CHEM 8  URINE CYTOLOGY ANCILLARY ONLY    Imaging Review Dg Abdomen 1 View  Result Date: 04/27/2017 CLINICAL DATA:  Abdominal Hurley for 3 weeks EXAM: ABDOMEN - 1 VIEW COMPARISON:  None. FINDINGS: Nonobstructed bowel-gas pattern with moderate to large amount of stool in the colon. No abnormal calcifications. IMPRESSION: Nonobstructed bowel-gas pattern with moderate to large amount of stool in the colon. Electronically Signed   By: Jasmine PangKim  Fujinaga M.D.   On: 04/27/2017 18:28        MDM   1. Bacterial vaginosis   2. Nephritis     Catherine Hurley is a 21 y.o. female here with urinary urgency, frequency, and dysuria along with vaginal discharge.  Differential diagnosis for urinary symptoms includes, but is not limited to, UTI, pyelonephritis, ureterallithiasis, STI, reaction to hygiene products, and others.  VSS. Exam without any abdominal tenderness but does have left sided CVAT. Pt is afebrile & has not been vomiting. UA is significant for HgB, otherwise not particularly sugestive of UTI. KUB was obtained and negative for renal stone.   Will treat with Rocephin here and  keflex along with Zofran. UC sent. Follow up with PCP in 3-5 days for recheck as needed.   Urine cytology also obtained, treating presumptively for bacterial vaginitis, started on metronidazole. Also, patient gets yeast infections with antibiotics, given paper prescription for Diflucan, and instructions on its use if needed.  If symptoms worsen, develop back Hurley/fever/vomiting return to the urgent care, or go to the ER for further treatment.      Dorena BodoKennard, Rosina Cressler, NP 04/27/17 1926

## 2017-04-27 NOTE — ED Triage Notes (Signed)
[facility] a [facility] of lower back pain and dysuria and dark colored urine x 2 weeks.

## 2017-04-28 ENCOUNTER — Ambulatory Visit: Payer: BLUE CROSS/BLUE SHIELD | Admitting: Physician Assistant

## 2017-04-28 LAB — URINE CULTURE

## 2017-04-28 LAB — URINE CYTOLOGY ANCILLARY ONLY
CHLAMYDIA, DNA PROBE: NEGATIVE
Neisseria Gonorrhea: NEGATIVE
TRICH (WINDOWPATH): NEGATIVE

## 2017-04-30 LAB — CERVICOVAGINAL ANCILLARY ONLY: CANDIDA VAGINITIS: NEGATIVE

## 2017-05-14 ENCOUNTER — Ambulatory Visit: Payer: BLUE CROSS/BLUE SHIELD | Admitting: Rheumatology

## 2017-09-30 ENCOUNTER — Ambulatory Visit: Payer: BLUE CROSS/BLUE SHIELD | Admitting: Physician Assistant

## 2017-11-23 MED FILL — AMOXICILLIN 500 MG CAPSULE: 500 | 7 days supply | Qty: 21 | Fill #0

## 2017-12-01 ENCOUNTER — Ambulatory Visit: Payer: BLUE CROSS/BLUE SHIELD | Admitting: Women's Health

## 2017-12-01 DIAGNOSIS — Z0289 Encounter for other administrative examinations: Secondary | ICD-10-CM

## 2017-12-01 MED FILL — ACETAMINOPHEN/COD #3 TABLET: 300-30 | 2 days supply | Qty: 9 | Fill #0

## 2017-12-01 MED FILL — IBUPROFEN 400 MG TABS: 400 | 5 days supply | Qty: 30 | Fill #0

## 2017-12-01 MED FILL — CHLORHEXIDINE 0.12% RINSE: 0.12 | 24 days supply | Qty: 473 | Fill #0

## 2017-12-14 MED FILL — NORETHINDRONE 0.35 MG TAB: 0.35 | 28 days supply | Qty: 28 | Fill #0

## 2017-12-14 MED FILL — SPIRONOLACTONE 100 MG TABS: 100 | 30 days supply | Qty: 30 | Fill #0

## 2018-03-14 MED FILL — ACETAMINOPHEN/COD #3 TABLET: 300-30 | 2 days supply | Qty: 12 | Fill #0

## 2018-03-22 ENCOUNTER — Ambulatory Visit (INDEPENDENT_AMBULATORY_CARE_PROVIDER_SITE_OTHER): Payer: No Typology Code available for payment source | Admitting: Urgent Care

## 2018-03-22 ENCOUNTER — Encounter: Payer: Self-pay | Admitting: Urgent Care

## 2018-03-22 VITALS — BP 130/80 | HR 112 | Temp 98.0°F | Resp 16 | Ht 64.0 in | Wt 182.2 lb

## 2018-03-22 DIAGNOSIS — G44209 Tension-type headache, unspecified, not intractable: Secondary | ICD-10-CM

## 2018-03-22 DIAGNOSIS — J029 Acute pharyngitis, unspecified: Secondary | ICD-10-CM | POA: Diagnosis not present

## 2018-03-22 DIAGNOSIS — R591 Generalized enlarged lymph nodes: Secondary | ICD-10-CM

## 2018-03-22 DIAGNOSIS — R0989 Other specified symptoms and signs involving the circulatory and respiratory systems: Secondary | ICD-10-CM

## 2018-03-22 DIAGNOSIS — R05 Cough: Secondary | ICD-10-CM | POA: Diagnosis not present

## 2018-03-22 DIAGNOSIS — R059 Cough, unspecified: Secondary | ICD-10-CM

## 2018-03-22 DIAGNOSIS — R070 Pain in throat: Secondary | ICD-10-CM

## 2018-03-22 DIAGNOSIS — Z9109 Other allergy status, other than to drugs and biological substances: Secondary | ICD-10-CM

## 2018-03-22 LAB — POCT RAPID STREP A (OFFICE): RAPID STREP A SCREEN: NEGATIVE

## 2018-03-22 MED ORDER — HYDROCODONE-HOMATROPINE 5-1.5 MG/5ML PO SYRP: 5.0000 mL | ORAL_SOLUTION | Freq: Every evening | ORAL | 0 refills | Status: DC | PRN

## 2018-03-22 NOTE — Progress Notes (Signed)
    MRN: [id number] DOB: November 28, 1995  Subjective:   Catherine Hurley is a 22 y.o. female presenting for 3 day history of worsening sore throat, lymph node pain, subjective fever, dry cough. Has tried 2 nasal sprays, Atrovent and mometasone, cough drops, Tylenol #3 (from having a tooth pulled in January 2019). Has a history of tonsillectomy at 22 y/o. Denies sinus congestion, runny nose, ear pain, sinus pain, chest pain, shob, wheezing, n/v, abdominal pain, rash.  Denies smoking cigarettes.  Catherine Hurley has a current medication list which includes the following prescription(s): acetaminophen-codeine and norethindrone. Also has No Known Allergies.  Catherine Hurley  has a past medical history of Allergy, Autoimmune disorder (HCC), Depression, and PCOS (polycystic ovarian syndrome). Also  has a past surgical history that includes tonsils and adnoids.  Objective:   Vitals: BP 130/80   Pulse (!) 112   Temp 98 F (36.7 C) (Oral)   Resp 16   Ht  (1.626 m)   Wt 182 lb 3.2 oz (82.6 kg)   SpO2 98%   BMI 31.27 kg/m   Physical Exam  Constitutional: She is oriented to person, place, and time. She appears well-developed and well-nourished.  HENT:  Right Ear: Tympanic membrane normal.  Left Ear: Tympanic membrane normal.  Nose: No mucosal edema, rhinorrhea or sinus tenderness.  Mouth/Throat: Oropharynx is clear and moist.  Eyes: Right eye exhibits no discharge. Left eye exhibits no discharge. No scleral icterus.  Cardiovascular: Normal rate, regular rhythm and intact distal pulses. Exam reveals no gallop and no friction rub.  No murmur heard. Pulmonary/Chest: No respiratory distress. She has no wheezes. She has no rales.  Neurological: She is alert and oriented to person, place, and time.  Skin: Skin is warm and dry.  Psychiatric: She has a normal mood and affect.    Results for orders placed or performed in visit on 03/22/18 (from the past 24 hour(s))  POCT rapid strep A     Status: None   Collection Time:  03/22/18  6:01 PM  Result Value Ref Range   Rapid Strep A Screen Negative Negative   Assessment and Plan :   Acute pharyngitis, unspecified etiology  Throat pain - Plan: POCT rapid strep A, Culture, Group A Strep  Tenderness of lymph node - Plan: POCT rapid strep A  Cough  Tension headache  Environmental allergies  Will use supportive care for what I suspect is viral illness, strep culture pending.  Patient is to maintain her allergy medicines. Counseled patient on potential for adverse effects with medications prescribed today, patient verbalized understanding. Return-to-clinic precautions discussed, patient verbalized understanding.   Wallis Bamberg, PA-C Primary Care at [facility] [phone] 03/22/2018  5:49 PM

## 2018-03-22 NOTE — Patient Instructions (Addendum)
You may take  Tylenol with ibuprofen 400-600mg  every 6 hours for pain and inflammation. Hydrate well with at least 2 liters (1 gallon) of water daily. For sore throat try using a honey-based tea. Use 3 teaspoons of honey with juice squeezed from half lemon. Place shaved pieces of ginger into 1/2-1 cup of water and warm over stove top. Then mix the ingredients and repeat every 4 hours as needed.    Pharyngitis Pharyngitis is redness, pain, and swelling (inflammation) of the throat (pharynx). It is a very common cause of sore throat. Pharyngitis can be caused by a bacteria, but it is usually caused by a virus. Most cases of pharyngitis get better on their own without treatment. What are the causes? This condition may be caused by:  Infection by viruses (viral). Viral pharyngitis spreads from person to person (is contagious) through coughing, sneezing, and sharing of personal items or utensils such as cups, forks, spoons, and toothbrushes.  Infection by bacteria (bacterial). Bacterial pharyngitis may be spread by touching the nose or face after coming in contact with the bacteria, or through more intimate contact, such as kissing.  Allergies. Allergies can cause buildup of mucus in the throat (post-nasal drip), leading to inflammation and irritation. Allergies can also cause blocked nasal passages, forcing breathing through the mouth, which dries and irritates the throat.  What increases the risk? You are more likely to develop this condition if:  You are 46-94 years old.  You are exposed to crowded environments such as daycare, school, or dormitory living.  You live in a cold climate.  You have a weakened disease-fighting (immune) system.  What are the signs or symptoms? Symptoms of this condition vary by the cause (viral, bacterial, or allergies) and can include:  Sore throat.  Fatigue.  Low-grade fever.  Headache.  Joint pain and muscle aches.  Skin rashes.  Swollen  glands in the throat (lymph nodes).  Plaque-like film on the throat or tonsils. This is often a symptom of bacterial pharyngitis.  Vomiting.  Stuffy nose (nasal congestion).  Cough.  Red, itchy eyes (conjunctivitis).  Loss of appetite.  How is this diagnosed? This condition is often diagnosed based on your medical history and a physical exam. Your health care provider will ask you questions about your illness and your symptoms. A swab of your throat may be done to check for bacteria (rapid strep test). Other lab tests may also be done, depending on the suspected cause, but these are rare. How is this treated? This condition usually gets better in 3-4 days without medicine. Bacterial pharyngitis may be treated with antibiotic medicines. Follow these instructions at home:  Take over-the-counter and prescription medicines only as told by your health care provider. ? If you were prescribed an antibiotic medicine, take it as told by your health care provider. Do not stop taking the antibiotic even if you start to feel better. ? Do not give children aspirin because of the association with Reye syndrome.  Drink enough water and fluids to keep your urine clear or pale yellow.  Get a lot of rest.  Gargle with a salt-water mixture 3-4 times a day or as needed. To make a salt-water mixture, completely dissolve -1 tsp of salt in 1 cup of warm water.  If your health care provider approves, you may use throat lozenges or sprays to soothe your throat. Contact a health care provider if:  You have large, tender lumps in your neck.  You have a rash.  You cough up green, yellow-brown, or bloody spit. Get help right away if:  Your neck becomes stiff.  You drool or are unable to swallow liquids.  You cannot drink or take medicines without vomiting.  You have severe pain that does not go away, even after you take medicine.  You have trouble breathing, and it is not caused by a stuffy  nose.  You have new pain and swelling in your joints such as the knees, ankles, wrists, or elbows. Summary  Pharyngitis is redness, pain, and swelling (inflammation) of the throat (pharynx).  While pharyngitis can be caused by a bacteria, the most common causes are viral.  Most cases of pharyngitis get better on their own without treatment.  Bacterial pharyngitis is treated with antibiotic medicines. This information is not intended to replace advice given to you by your health care provider. Make sure you discuss any questions you have with your health care provider. Document Released: 10/19/2005 Document Revised: 11/24/2016 Document Reviewed: 11/24/2016 Elsevier Interactive Patient Education  2018 [employer].    IF you received an x-ray today, you will receive an invoice from [facility]. Please contact [facility] at [phone] with questions or concerns regarding your invoice.   IF you received labwork today, you will receive an invoice from [facility]. Please contact [facility] at [phone] with questions or concerns regarding your invoice.   Our billing staff will not be able to assist you with questions regarding bills from these companies.  You will be contacted with the lab results as soon as they are available. The fastest way to get your results is to activate your My Chart account. Instructions are located on the last page of this paperwork. If you have not heard from [place] regarding the results in 2 weeks, please contact this office.

## 2018-03-23 MED FILL — HYDROCODONE-HOMATROPINE SYR: 5-1.5 | 20 days supply | Qty: 100 | Fill #0

## 2018-03-24 ENCOUNTER — Encounter: Payer: Self-pay | Admitting: Urgent Care

## 2018-03-24 LAB — CULTURE, GROUP A STREP: Strep A Culture: NEGATIVE

## 2018-03-31 ENCOUNTER — Encounter: Payer: Self-pay | Admitting: Gastroenterology

## 2018-03-31 ENCOUNTER — Ambulatory Visit (INDEPENDENT_AMBULATORY_CARE_PROVIDER_SITE_OTHER): Payer: PRIVATE HEALTH INSURANCE | Admitting: Gastroenterology

## 2018-03-31 VITALS — BP 130/84 | HR 86 | Ht 64.0 in | Wt 182.0 lb

## 2018-03-31 DIAGNOSIS — K582 Mixed irritable bowel syndrome: Secondary | ICD-10-CM | POA: Diagnosis not present

## 2018-03-31 DIAGNOSIS — R140 Abdominal distension (gaseous): Secondary | ICD-10-CM

## 2018-03-31 DIAGNOSIS — R635 Abnormal weight gain: Secondary | ICD-10-CM

## 2018-03-31 NOTE — Patient Instructions (Addendum)
If you are age 24 or older, your body mass index should be between 23-30. Your Body mass index is 31.24 kg/m. If this is out of the aforementioned range listed, please consider follow up with your Primary Care Provider.  If you are age 61 or younger, your body mass index should be between 19-25. Your Body mass index is 31.24 kg/m. If this is out of the aformentioned range listed, please consider follow up with your Primary Care Provider.     Food Guidelines for a sensitive stomach  Many people have difficulty digesting certain foods, causing a variety of distressing and embarrassing symptoms such as abdominal pain, bloating and gas.  These foods may need to be avoided or consumed in small amounts.  Here are some tips that might be helpful for you.  1.   Lactose intolerance is the difficulty or complete inability to digest lactose, the natural sugar in milk and anything made from milk.  This condition is harmless, common, and can begin any time during life.  Some people can digest a modest amount of lactose while others cannot tolerate any.  Also, not all dairy products contain equal amounts of lactose.  For example, hard cheeses such as parmesan have less lactose than soft cheeses such as cheddar.  Yogurt has less lactose than milk or cheese.  Many packaged foods (even many brands of bread) have milk, so read ingredient lists carefully.  It is difficult to test for lactose intolerance, so just try avoiding lactose as much as possible for a week and see what happens with your symptoms.  If you seem to be lactose intolerant, the best plan is to avoid it (but make sure you get calcium from another source).  The next best thing is to use lactase enzyme supplements, available over the counter everywhere.  Just know that many lactose intolerant people need to take several tablets with each serving of dairy to avoid symptoms.  Lastly, a lot of restaurant food is made with milk or butter.  Many are things you  might not suspect, such as mashed potatoes, rice and pasta (cooked with butter) and "grilled" items.  If you are lactose intolerant, it never hurts to ask your server what has milk or butter.  2.   Fiber is an important part of your diet, but not all fiber is well-tolerated.  Insoluble fiber such as bran is often consumed by normal gut bacteria and converted into gas.  Soluble fiber such as oats, squash, carrots and green beans are typically tolerated better.  3.   Some types of carbohydrates can be poorly digested.  Examples include: fructose (apples, cherries, pears, raisins and other dried fruits), fructans (onions, zucchini, large amounts of wheat), sorbitol/mannitol/xylitol and sucralose/Splenda (common artificial sweeteners), and raffinose (lentils, broccoli, cabbage, asparagus, brussel sprouts, many types of beans).  Do a Programmer, multimedia for [employer] [place] and you will find helpful information. Beano, a dietary supplement, will often help with raffinose-containing foods.  As with lactase tablets, you may need several per serving.  4.   Whenever possible, avoid processed food&meats and chemical additives.  High fructose corn syrup, a common sweetener, may be difficult to digest.  Eggs and soy (comes from the soybean, and added to many foods now) are other common bloating/gassy foods.  - Dr. [facility] _________________________________________________________________  Take a bottle of magnesium citrate some evening to purge the colon and get some relief from constipation and bloating.  Afterwards, start Benefiber one tablespoon daily.  You have been scheduled for an abdominal ultrasound at [facility] (1st floor of hospital) on 04-06-2018 at 1130am. Please arrive 15 minutes prior to your appointment for registration. Make certain not to have anything to eat or drink 6 hours prior to your appointment. Should you need to reschedule your appointment, please contact  radiology at [phone]. This test typically takes about 30 minutes to perform.   It was a pleasure to meet you today!  Dr. Myrtie Neither

## 2018-03-31 NOTE — Progress Notes (Signed)
[facility] Consult Note:  History: Catherine Hurley 03/31/2018   Reason for consult/chief complaint: Change in bowels (white film over the stool, stool comes out like "pebbles", mushy stool, diarrhea, urgency) and Gas   Subjective  HPI:  This is a very pleasant woman self-referred for long-standing digestive issues.  She reports a diagnosis of PCOS at about age 22, and has been on OCPs since then.  She admits to only taking them from time to time because she forgets due to her work and school schedule.  She has been followed by gynecology practice at [facility] in [place], where she is from.  Last on December 2018, was able to read the note.  They offered some additional hormonal therapies.Mashelle is in school here and works part-time as a Water quality scientist, but has not obtain local primary care or gynecology.  She seems to recall an ultrasound of the pelvis around the time she was diagnosed with PCOS. Regarding her digestive symptoms, she is bothered by abdominal bloating and distention with weight gain and altered bowel habits.  She tends toward constipation with the stools will come out like pebbles, and other times it will be "mushy" with some urgency.  She is bothered by increased intestinal gas, and she is concerned that the bloating and weight gain may be related to the digestive symptoms. She was more concerned recently when she had an episode of a white stringy rectal discharge during a bowel movement. She also reports many years of "sensitive stomach" where some foods will need to seem to cause constipation or diarrhea.  He feels like she got to the point where she does not know what to eat.  ROS:  Review of Systems  Constitutional: Positive for fatigue. Negative for appetite change and unexpected weight change.  HENT: Negative for mouth sores and voice change.   Eyes: Negative for pain and redness.  Respiratory: Negative for cough and shortness of breath.     Cardiovascular: Negative for chest pain and palpitations.  Genitourinary: Positive for frequency. Negative for dysuria and hematuria.  Musculoskeletal: Negative for arthralgias and myalgias.  Skin: Negative for pallor and rash.  Neurological: Positive for headaches. Negative for weakness.  Hematological: Negative for adenopathy.  Psychiatric/Behavioral: The patient is nervous/anxious.      Past Medical History: Past Medical History:  Diagnosis Date  . Allergy   . Anxiety   . Autoimmune disorder (HCC)   . Depression   . Fibromyalgia   . PCOS (polycystic ovarian syndrome)      Past Surgical History: Past Surgical History:  Procedure Laterality Date  . tonsils and adnoids  2001     Family History: Family History  Problem Relation Age of Onset  . Diabetes Mother   . Hypertension Mother   . Diabetes Father   . Mental illness Father   . Diabetes Maternal Grandmother   . Hyperlipidemia Maternal Grandmother   . Hypertension Maternal Grandmother   . Stroke Maternal Grandmother   . Heart attack Maternal Grandmother   . Lung cancer Maternal Grandmother        smoker  . Other Maternal Grandmother        renal  failure  . Cancer Paternal Grandmother        ovarian  . Stroke Paternal Grandmother   . Hypertension Paternal Grandmother   . Colon cancer Neg Hx   . Esophageal cancer Neg Hx   . Rectal cancer Neg Hx    No known family history of ulcerative colitis  or Crohn's disease.  Social History: Social History   Socioeconomic History  . Marital status: Single    Spouse name: Not on file  . Number of children: 0  . Years of education: Not on file  . Highest education level: Not on file  Occupational History  . Occupation: Research scientist (life sciences): [employer]  Social Needs  . Financial resource strain: Not on file  . Food insecurity:    Worry: Not on file    Inability: Not on file  . Transportation needs:    Medical: Not on file    Non-medical: Not on file   Tobacco Use  . Smoking status: Never Smoker  . Smokeless tobacco: Never Used  Substance and Sexual Activity  . Alcohol use: Yes    Comment: rare  . Drug use: No  . Sexual activity: Not on file  Lifestyle  . Physical activity:    Days per week: Not on file    Minutes per session: Not on file  . Stress: Not on file  Relationships  . Social connections:    Talks on phone: Not on file    Gets together: Not on file    Attends religious service: Not on file    Active member of club or organization: Not on file    Attends meetings of clubs or organizations: Not on file    Relationship status: Not on file  Other Topics Concern  . Not on file  Social History Narrative   Hopes to become a Advice worker.    Allergies: No Known Allergies  Outpatient Meds: Current Outpatient Medications  Medication Sig Dispense Refill  . acetaminophen-codeine (TYLENOL #3) 300-30 MG tablet Take 1 tablet by mouth every 6 (six) hours as needed. for pain  0  . HYDROcodone-homatropine (HYCODAN) 5-1.5 MG/5ML syrup Take 5 mLs by mouth at bedtime as needed. 100 mL 0  . norethindrone (MICRONOR,CAMILA,ERRIN) 0.35 MG tablet Take 1 tablet by mouth daily.  12   No current facility-administered medications for this visit.       ___________________________________________________________________ Objective   Exam:  BP 130/84   Pulse 86   Ht  (1.626 m)   Wt 182 lb (82.6 kg)   BMI 31.24 kg/m    General: this is a(n) pleasant and well-appearing woman.  She seems a little stressed today after her busy morning of phlebotomy at a local nursing home.  Hirsutism  Eyes: sclera anicteric, no redness  ENT: oral mucosa moist without lesions, no cervical or supraclavicular lymphadenopathy, good dentition  CV: RRR without murmur, S1/S2, no JVD, no peripheral edema  Resp: clear to auscultation bilaterally, normal RR and effort noted  GI: soft, no tenderness, with active bowel sounds. No guarding  or palpable organomegaly noted.  Skin; warm and dry, no rash or jaundice noted  Neuro: awake, alert and oriented x 3. Normal gross motor function and fluent speech  No data for review  Assessment: Encounter Diagnoses  Name Primary?  . Irritable bowel syndrome with both constipation and diarrhea Yes  . Abdominal bloating   . Weight gain     She has typical symptoms of IBS with alternating bowel habits.  Some of the symptoms such as the bloating and weight gain seem likely related to the PCOS.  Exam is not consistent with ascites.  She has not had abdominal or pelvic imaging in a long time.  I think there may be an anxiety component exacerbating the symptoms as well. It sounds  like she had passage of some rectal mucus, which I reassured her is common and benign occurrence.  Her symptoms are not typical for inflammatory bowel disease. Plan:  She feels as if she could just get a bowel purge, she might have relief of the bloating and distention.  I recommended a bottle of magnesium citrate one day when she is not working. After that, take 1 tablespoon of Benefiber daily.  Antispasmodic therapy may exacerbate the constipation.  Some written IBS dietary advice was given to hopefully improve bloating and gas  Abdominal ultrasound scheduled to assess for ascites or large cysts.  Thank you for the courtesy of this consult.  Please call me with any questions or concerns.  Charlie Pitter III

## 2018-04-06 ENCOUNTER — Ambulatory Visit (HOSPITAL_COMMUNITY): Payer: PRIVATE HEALTH INSURANCE

## 2018-04-20 ENCOUNTER — Ambulatory Visit (HOSPITAL_COMMUNITY): Payer: No Typology Code available for payment source

## 2018-05-12 MED FILL — metroNIDAZOLE 500 MG TABS: 500 | 7 days supply | Qty: 14 | Fill #0

## 2018-09-03 ENCOUNTER — Ambulatory Visit: Payer: No Typology Code available for payment source | Admitting: Family Medicine

## 2018-09-13 ENCOUNTER — Encounter (HOSPITAL_COMMUNITY): Payer: Self-pay | Admitting: Emergency Medicine

## 2018-09-13 ENCOUNTER — Ambulatory Visit (HOSPITAL_COMMUNITY)
Admission: EM | Admit: 2018-09-13 | Discharge: 2018-09-13 | Disposition: A | Discharge status: Home / Self Care | Payer: BLUE CROSS/BLUE SHIELD | Attending: Family Medicine | Admitting: Family Medicine

## 2018-09-13 DIAGNOSIS — R519 Headache, unspecified: Secondary | ICD-10-CM

## 2018-09-13 DIAGNOSIS — R51 Headache: Secondary | ICD-10-CM | POA: Diagnosis not present

## 2018-09-13 MED ORDER — IBUPROFEN 800 MG PO TABS
ORAL_TABLET | ORAL | Status: AC
  Filled 2018-09-13: qty 1

## 2018-09-13 MED ORDER — IBUPROFEN 800 MG PO TABS
800.0000 mg | ORAL_TABLET | Freq: Once | ORAL | Status: AC
  Administered 2018-09-13: 800 mg via ORAL

## 2018-09-13 MED ORDER — IBUPROFEN 600 MG PO TABS: 600.0000 mg | ORAL_TABLET | Freq: Four times a day (QID) | ORAL | 0 refills | Status: DC | PRN

## 2018-09-13 MED FILL — IBUPROFEN 600 MG TABLET: 600 | 8 days supply | Qty: 30 | Fill #0

## 2018-09-13 NOTE — ED Provider Notes (Signed)
[facility]    CSN: [id number] Arrival date & time: 09/13/18  1237     History   Chief Complaint Chief Complaint  Patient presents with  . Headache    HPI Catherine Hurley is a 22 y.o. female history of PCOS, fibromyalgia, functional neurological disorder presenting today for evaluation of headache and eye twitching.  Patient states that she developed a headache this morning as she woke up from, has gradually worsened throughout the day.  Headache begins in the occipital region and radiates in towards her right eye.  She also has twitching of her right eyelid causing her eye to be closed.  She frequently will have this with headaches since 2015.  She has been evaluated by neurology, MRI is negative.  Diagnosed with functional disorder.  Patient has not taken any medicines for her symptoms.  Typically rest helps her headaches.  She has some mild nausea, denies vomiting.  Denies weakness, denies difficulty speaking.  HPI  Past Medical History:  Diagnosis Date  . Allergy   . Anxiety   . Autoimmune disorder (HCC)   . Depression   . Fibromyalgia   . PCOS (polycystic ovarian syndrome)     Patient Active Problem List   Diagnosis Date Noted  . 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 PCOS (polycystic ovarian syndrome) 08/14/2016  . Fibromyalgia 08/14/2016  . Positive ANA (antinuclear antibody) 08/14/2016    Past Surgical History:  Procedure Laterality Date  . tonsils and adnoids  2001    OB History   None      Home Medications    Prior to Admission medications   Medication Sig Start Date End Date Taking? Authorizing Provider  acetaminophen-codeine (TYLENOL #3) 300-30 MG tablet Take 1 tablet by mouth every 6 (six) hours as needed. for pain 03/14/18   [provider]  HYDROcodone-homatropine (HYCODAN) 5-1.5 MG/5ML syrup Take 5 mLs by mouth at bedtime as needed. 03/22/18   Wallis Bamberg, PA-C  ibuprofen (ADVIL,MOTRIN) 600 MG  tablet Take 1 tablet (600 mg total) by mouth every 6 (six) hours as needed. 09/13/18   Wieters, Hallie C, PA-C  norethindrone (MICRONOR,CAMILA,ERRIN) 0.35 MG tablet Take 1 tablet by mouth daily. 12/14/17   [provider]    Family History Family History  Problem Relation Age of Onset  . Diabetes Mother   . Hypertension Mother   . Diabetes Father   . Mental illness Father   . Diabetes Maternal Grandmother   . Hyperlipidemia Maternal Grandmother   . Hypertension Maternal Grandmother   . Stroke Maternal Grandmother   . Heart attack Maternal Grandmother   . Lung cancer Maternal Grandmother        smoker  . Other Maternal Grandmother        renal  failure  . Cancer Paternal Grandmother        ovarian  . Stroke Paternal Grandmother   . Hypertension Paternal Grandmother   . Colon cancer Neg Hx   . Esophageal cancer Neg Hx   . Rectal cancer Neg Hx     Social History Social History   Tobacco Use  . Smoking status: Never Smoker  . Smokeless tobacco: Never Used  Substance Use Topics  . Alcohol use: Yes    Comment: rare  . Drug use: No     Allergies   Patient has no known allergies.   Review of Systems Review of Systems  Constitutional: Negative for fatigue and fever.  HENT: Negative for congestion,  sinus pressure and sore throat.   Eyes: Negative for photophobia, pain and visual disturbance.  Respiratory: Negative for cough and shortness of breath.   Cardiovascular: Negative for chest pain.  Gastrointestinal: Negative for abdominal pain, nausea and vomiting.  Genitourinary: Negative for decreased urine volume and hematuria.  Musculoskeletal: Positive for myalgias. Negative for neck pain and neck stiffness.  Neurological: Positive for headaches. Negative for dizziness, syncope, facial asymmetry, speech difficulty, weakness, light-headedness and numbness.     Physical Exam Triage Vital Signs ED Triage Vitals  Enc Vitals Group     BP 09/13/18 1311 140/75      Pulse Rate 09/13/18 1311 87     Resp 09/13/18 1311 18     Temp 09/13/18 1311 97.9 F (36.6 C)     Temp Source 09/13/18 1311 Oral     SpO2 09/13/18 1311 100 %     Weight --      Height --      Head Circumference --      Peak Flow --      Pain Score 09/13/18 1312 10     Pain Loc --      Pain Edu? --      Excl. in GC? --    No data found.  Updated Vital Signs BP 140/75 (BP Location: Right Arm)   Pulse 87   Temp 97.9 F (36.6 C) (Oral)   Resp 18   SpO2 100%   Visual Acuity Right Eye Distance:   Left Eye Distance:   Bilateral Distance:    Right Eye Near:   Left Eye Near:    Bilateral Near:     Physical Exam  Constitutional: She is oriented to person, place, and time. She appears well-developed and well-nourished. No distress.  HENT:  Head: Normocephalic and atraumatic.  Bilateral ears without tenderness to palpation of external auricle, tragus and mastoid, EAC's without erythema or swelling, TM's with good bony landmarks and cone of light. Non erythematous.  Oral mucosa pink and moist, no tonsillar enlargement or exudate. Posterior pharynx patent and nonerythematous, no uvula deviation or swelling. Normal phonation.  Eyes: Conjunctivae are normal.  Right eyelid drooping, visible twitching with in eyelid PE RRL bilaterally, EOMI  Neck: Neck supple.  Cardiovascular: Normal rate and regular rhythm.  No murmur heard. Pulmonary/Chest: Effort normal and breath sounds normal. No respiratory distress.  Abdominal: Soft. There is no tenderness.  Musculoskeletal: She exhibits no edema.  Neurological: She is alert and oriented to person, place, and time.  Patient A&O x3, cranial nerves II-XII grossly intact, strength at shoulders, hips and knees 5/5, equal bilaterally, patellar reflex 2+ bilaterally.  Normal speech, facial muscles intact, able to fully tightly close eyes despite twitching.  Gait without abnormality.  Skin: Skin is warm and dry.  Psychiatric: She has a normal mood  and affect.  Nursing note and vitals reviewed.    UC Treatments / Results  Labs (all labs ordered are listed, but only abnormal results are displayed) Labs Reviewed - No data to display  EKG None  Radiology No results found.  Procedures Procedures (including critical care time)  Medications Ordered in UC Medications  ibuprofen (ADVIL,MOTRIN) tablet 800 mg (800 mg Oral Given 09/13/18 1342)    Initial Impression / Assessment and Plan / UC Course  I have reviewed the triage vital signs and the nursing notes.  Pertinent labs & imaging results that were available during my care of the patient were reviewed by me and considered in my  medical decision making (see chart for details).    Long history of headache with associated eye twitching.  Her exam normal, no neuro deficits.  Vital signs stable.  Offered injections, given patient has not tried oral medicines, through shared decision made getting opted for oral trial first.  Will provide ibuprofen in clinic today, will send home with ibuprofen and continue Tylenol.  Will have patient follow-up if symptoms not resolving.  To emergency room if symptoms worsening.Discussed strict return precautions. Patient verbalized understanding and is agreeable with plan.  Final Clinical Impressions(s) / UC Diagnoses   Final diagnoses:  Acute nonintractable headache, unspecified headache type     Discharge Instructions     Use anti-inflammatories for pain/swelling. You may take up to 800 mg Ibuprofen every 8 hours with food. You may supplement Ibuprofen with Tylenol [phone] mg every 8 hours.   If having difficulty speaking, worsening facial drooping, worsening headache, not improving please follow-up   ED Prescriptions    Medication Sig Dispense Auth. Provider   ibuprofen (ADVIL,MOTRIN) 600 MG tablet Take 1 tablet (600 mg total) by mouth every 6 (six) hours as needed. 30 tablet Wieters, [place] C, PA-C     Controlled Substance  Prescriptions [place] Controlled Substance Registry consulted? Not Applicable   Lew Dawes, [place] 09/13/18 1359

## 2018-09-13 NOTE — ED Triage Notes (Signed)
Pt here for HA to right side of head starting today; pt sts making her right eye twitch; pt sts hx of same in past

## 2018-09-13 NOTE — Discharge Instructions (Addendum)
Use anti-inflammatories for pain/swelling. You may take up to 800 mg Ibuprofen every 8 hours with food. You may supplement Ibuprofen with Tylenol [phone] mg every 8 hours.   If having difficulty speaking, worsening facial drooping, worsening headache, not improving please follow-up

## 2018-11-07 MED FILL — HYDROCORTISONE 2.5% OINT: 2.5 | 10 days supply | Qty: 28 | Fill #0

## 2018-11-07 MED FILL — TAC 0.1%/SSD CREAM 3:1: 3 days supply | Qty: 220 | Fill #0

## 2018-11-07 MED FILL — DOXYCYCLINE HYC 100 MG CAPS: 100 | 30 days supply | Qty: 30 | Fill #0

## 2018-11-09 MED FILL — HYDROCODON-APAP 5-325: 5-325 | 3 days supply | Qty: 19 | Fill #0

## 2018-11-09 MED FILL — CHLORHEXIDINE 0.12% RINSE: 0.12 | 10 days supply | Qty: 473 | Fill #0

## 2018-11-09 MED FILL — IBUPROFEN 600 MG TABLET: 600 | 7 days supply | Qty: 30 | Fill #0

## 2018-11-14 MED FILL — TRI FEMYNOR 28 TABLET: 0.18/0.215/ | 84 days supply | Qty: 84 | Fill #0

## 2018-11-14 MED FILL — FLUCONAZOLE 150 MG TABS: 150 | 2 days supply | Qty: 2 | Fill #0

## 2018-11-15 MED FILL — HYDROCODON-APAP 5-325: 5-325 | 3 days supply | Qty: 19 | Fill #0

## 2018-11-17 ENCOUNTER — Encounter (HOSPITAL_COMMUNITY): Payer: Self-pay

## 2018-11-17 ENCOUNTER — Ambulatory Visit (HOSPITAL_COMMUNITY)
Admission: EM | Admit: 2018-11-17 | Discharge: 2018-11-17 | Disposition: A | Discharge status: Home / Self Care | Payer: BLUE CROSS/BLUE SHIELD | Attending: Family Medicine | Admitting: Family Medicine

## 2018-11-17 DIAGNOSIS — J069 Acute upper respiratory infection, unspecified: Secondary | ICD-10-CM | POA: Diagnosis not present

## 2018-11-17 DIAGNOSIS — B9789 Other viral agents as the cause of diseases classified elsewhere: Secondary | ICD-10-CM | POA: Diagnosis not present

## 2018-11-17 MED ORDER — LIDOCAINE VISCOUS HCL 2 % MT SOLN: 15.0000 mL | OROMUCOSAL | 0 refills | Status: DC | PRN

## 2018-11-17 MED ORDER — BENZONATATE 100 MG PO CAPS: 100.0000 mg | ORAL_CAPSULE | Freq: Three times a day (TID) | ORAL | 0 refills | Status: DC

## 2018-11-17 MED FILL — BENZONATATE 100 MG CAPS: 100 | 7 days supply | Qty: 21 | Fill #0

## 2018-11-17 MED FILL — LIDOCAINE 2% VISCOUS SOLN: 2 | 7 days supply | Qty: 100 | Fill #0

## 2018-11-17 NOTE — ED Triage Notes (Signed)
Pt presents with complaints of cough and sore throat since Saturday. Pt endorses being treated by another urgent care with medications and denies any relief. Pt did have wisdom teeth out last Wednesday.

## 2018-11-17 NOTE — Discharge Instructions (Addendum)
I believe that you have is a viral upper respiratory infection You can do Tessalon Perles every 8 hours for cough Take the Zyrtec daily that you have at home Vicious lidocaine for sore throat You may discontinue the penicillin Follow up as needed for continued or worsening symptoms

## 2018-11-18 NOTE — ED Provider Notes (Signed)
[facility]    CSN: [id number] Arrival date & time: 11/17/18  0854     History   Chief Complaint Chief Complaint  Patient presents with  . Cough    HPI Catherine Hurley is a 23 y.o. female.   Patient is a 23 year old female that presents with Cough sore throat since Saturday.  She was treated by another urgent care with penicillin for pharyngitis with negative rapid strep.  The cough has been nonproductive and hacking.  She reports having to leave class due to coughing so much.  She reports that her symptoms have not gotten any better.  She is having pain with swallowing and body aches.  She did have some wisdom teeth removed approximately 1 week ago.  She has been taking hydrocodone and ibuprofen for pain.  This somewhat relieved her symptoms.  She denies any associated fever.   ROS per HPI      Past Medical History:  Diagnosis Date  . Allergy   . Anxiety   . Autoimmune disorder (HCC)   . Depression   . Fibromyalgia   . PCOS (polycystic ovarian syndrome)     Patient Active Problem List   Diagnosis Date Noted  . 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 PCOS (polycystic ovarian syndrome) 08/14/2016  . Fibromyalgia 08/14/2016  . Positive ANA (antinuclear antibody) 08/14/2016    Past Surgical History:  Procedure Laterality Date  . tonsils and adnoids  2001    OB History   No obstetric history on file.      Home Medications    Prior to Admission medications   Medication Sig Start Date End Date Taking? Authorizing Provider  acetaminophen-codeine (TYLENOL #3) 300-30 MG tablet Take 1 tablet by mouth every 6 (six) hours as needed. for pain 03/14/18   [provider]  benzonatate (TESSALON) 100 MG capsule Take 1 capsule (100 mg total) by mouth every 8 (eight) hours. 11/17/18   Landyn Lorincz, Gloris [place] A, NP  HYDROcodone-homatropine (HYCODAN) 5-1.5 MG/5ML syrup Take 5 mLs by mouth at bedtime as needed. 03/22/18   Wallis Bamberg, PA-C    ibuprofen (ADVIL,MOTRIN) 600 MG tablet Take 1 tablet (600 mg total) by mouth every 6 (six) hours as needed. 09/13/18   Wieters, Hallie C, PA-C  lidocaine (XYLOCAINE) 2 % solution Use as directed 15 mLs in the mouth or throat as needed for mouth pain. 11/17/18   Dahlia Byes A, NP  norethindrone (MICRONOR,CAMILA,ERRIN) 0.35 MG tablet Take 1 tablet by mouth daily. 12/14/17   [provider]    Family History Family History  Problem Relation Age of Onset  . Diabetes Mother   . Hypertension Mother   . Diabetes Father   . Mental illness Father   . Diabetes Maternal Grandmother   . Hyperlipidemia Maternal Grandmother   . Hypertension Maternal Grandmother   . Stroke Maternal Grandmother   . Heart attack Maternal Grandmother   . Lung cancer Maternal Grandmother        smoker  . Other Maternal Grandmother        renal  failure  . Cancer Paternal Grandmother        ovarian  . Stroke Paternal Grandmother   . Hypertension Paternal Grandmother   . Colon cancer Neg Hx   . Esophageal cancer Neg Hx   . Rectal cancer Neg Hx     Social History Social History   Tobacco Use  . Smoking status: Never Smoker  . Smokeless tobacco: Never Used  Substance Use Topics  . Alcohol use: Yes    Comment: rare  . Drug use: No     Allergies   Patient has no known allergies.   Review of Systems Review of Systems   Physical Exam Triage Vital Signs ED Triage Vitals  Enc Vitals Group     BP 11/17/18 0916 (!) 146/98     Pulse Rate 11/17/18 0916 97     Resp 11/17/18 0916 18     Temp 11/17/18 0916 98.4 F (36.9 C)     Temp src --      SpO2 11/17/18 0916 100 %     Weight --      Height --      Head Circumference --      Peak Flow --      Pain Score 11/17/18 0917 5     Pain Loc --      Pain Edu? --      Excl. in GC? --    No data found.  Updated Vital Signs BP (!) 146/98   Pulse 97   Temp 98.4 F (36.9 C)   Resp 18   SpO2 100%   Visual Acuity Right Eye Distance:   Left  Eye Distance:   Bilateral Distance:    Right Eye Near:   Left Eye Near:    Bilateral Near:     Physical Exam Vitals signs and nursing note reviewed.  Constitutional:      General: She is not in acute distress.    Appearance: She is well-developed.  HENT:     Head: Normocephalic and atraumatic.     Right Ear: Tympanic membrane, ear canal and external ear normal.     Left Ear: Tympanic membrane, ear canal and external ear normal.     Mouth/Throat:     Pharynx: Posterior oropharyngeal erythema present.     Comments: 2+ tonsillar swelling with erythema.  No exudates. Eyes:     Conjunctiva/sclera: Conjunctivae normal.  Neck:     Musculoskeletal: Neck supple.  Cardiovascular:     Rate and Rhythm: Normal rate and regular rhythm.     Heart sounds: No murmur.  Pulmonary:     Effort: Pulmonary effort is normal. No respiratory distress.     Breath sounds: Normal breath sounds.  Abdominal:     Palpations: Abdomen is soft.     Tenderness: There is no abdominal tenderness.  Skin:    General: Skin is warm and dry.  Neurological:     Mental Status: She is alert.  Psychiatric:        Mood and Affect: Mood normal.      UC Treatments / Results  Labs (all labs ordered are listed, but only abnormal results are displayed) Labs Reviewed - No data to display  EKG None  Radiology No results found.  Procedures Procedures (including critical care time)  Medications Ordered in UC Medications - No data to display  Initial Impression / Assessment and Plan / UC Course  I have reviewed the triage vital signs and the nursing notes.  Pertinent labs & imaging results that were available during my care of the patient were reviewed by me and considered in my medical decision making (see chart for details).     I believe that her symptoms are all viral related.  She was just seen by another urgent care and had a negative strep and negative strep culture. She has been taking penicillin  with no relief. That is probably  due to this being viral. She is complaining about the sore throat. We will do symptomatic treatment with lidocaine and Tessalon Perles for cough. School and work note given Follow up as needed for continued or worsening symptoms  Final Clinical Impressions(s) / UC Diagnoses   Final diagnoses:  Viral URI with cough     Discharge Instructions     I believe that you have is a viral upper respiratory infection You can do Tessalon Perles every 8 hours for cough Take the Zyrtec daily that you have at home Vicious lidocaine for sore throat You may discontinue the penicillin Follow up as needed for continued or worsening symptoms     ED Prescriptions    Medication Sig Dispense Auth. Provider   lidocaine (XYLOCAINE) 2 % solution Use as directed 15 mLs in the mouth or throat as needed for mouth pain. 100 mL Latham Kinzler A, NP   benzonatate (TESSALON) 100 MG capsule Take 1 capsule (100 mg total) by mouth every 8 (eight) hours. 21 capsule Dahlia ByesBast, Mikeala Girdler A, NP     Controlled Substance Prescriptions [place] Controlled Substance Registry consulted? no   Janace ArisBast, Daila Elbert A, NP 11/18/18 1039

## 2019-02-08 MED FILL — busPIRone HCL 5 MG TABS: 5 | 15 days supply | Qty: 45 | Fill #0

## 2019-03-29 MED FILL — NITROFURANTOIN MONO-MCR 100: 100 | 5 days supply | Qty: 10 | Fill #0

## 2019-04-04 MED FILL — metroNIDAZOLE 500 MG TABS: 500 | 7 days supply | Qty: 14 | Fill #0

## 2019-04-04 MED FILL — TRI FEMYNOR 28 TABLET: 0.18/0.215/ | 84 days supply | Qty: 84 | Fill #0

## 2019-04-19 MED FILL — PREVIDENT 5000 BOOSTER PLUS: 1.1 | 30 days supply | Qty: 100 | Fill #0

## 2019-05-11 MED FILL — FEMYNOR 0.25-35 MG-MCG TABS: 0.25-35 | 84 days supply | Qty: 84 | Fill #0

## 2019-07-06 ENCOUNTER — Other Ambulatory Visit: Payer: Self-pay | Admitting: Orthopedic Surgery

## 2019-07-06 DIAGNOSIS — S5332XD Traumatic rupture of left ulnar collateral ligament, subsequent encounter: Secondary | ICD-10-CM

## 2019-07-06 DIAGNOSIS — S63642D Sprain of metacarpophalangeal joint of left thumb, subsequent encounter: Secondary | ICD-10-CM

## 2019-07-12 ENCOUNTER — Ambulatory Visit
Admission: RE | Admit: 2019-07-12 | Discharge: 2019-07-12 | Disposition: A | Discharge status: Home / Self Care | Payer: BLUE CROSS/BLUE SHIELD | Source: Ambulatory Visit | Attending: Orthopedic Surgery | Admitting: Orthopedic Surgery

## 2019-07-12 DIAGNOSIS — S5332XD Traumatic rupture of left ulnar collateral ligament, subsequent encounter: Secondary | ICD-10-CM

## 2019-07-12 DIAGNOSIS — S63642D Sprain of metacarpophalangeal joint of left thumb, subsequent encounter: Secondary | ICD-10-CM

## 2019-07-12 MED ORDER — IOPAMIDOL (ISOVUE-M 200) INJECTION 41%
1.0000 mL | Freq: Once | INTRAMUSCULAR | Status: AC
  Administered 2019-07-12: 1 mL via INTRA_ARTICULAR

## 2019-07-21 MED FILL — MELOXICAM 7.5 MG TABLET: 7.5 | 30 days supply | Qty: 30 | Fill #0

## 2019-08-04 MED FILL — busPIRone HCL 5 MG TABS: 5 | 30 days supply | Qty: 60 | Fill #0

## 2019-08-04 MED FILL — CEPHALEXIN 500 MG CAPSULE: 500 | 7 days supply | Qty: 21 | Fill #0

## 2019-08-04 MED FILL — MUPIROCIN 2% OINTMENT: 2 | 7 days supply | Qty: 22 | Fill #0

## 2019-08-16 MED FILL — AZITHROMYCIN 250 MG TABLET: 250 | 5 days supply | Qty: 6 | Fill #0

## 2019-08-16 MED FILL — HYDROCODONE-HOMATROPINE SOL: 5-1.5 | 6 days supply | Qty: 120 | Fill #0

## 2019-08-16 MED FILL — FLUTICASONE PROP 50 MCG SPR: 50 | 30 days supply | Qty: 16 | Fill #0

## 2019-08-16 MED FILL — FLUCONAZOLE 150 MG TABLET: 150 | 2 days supply | Qty: 2 | Fill #0

## 2019-10-10 MED FILL — BENZONATATE 100 MG CAPS: 100 | 30 days supply | Qty: 90 | Fill #0

## 2019-10-18 MED FILL — metroNIDAZOLE 500 MG TABS: 500 | 7 days supply | Qty: 14 | Fill #0

## 2019-10-25 MED FILL — CELECOXIB 200 MG CAP: 200 | 30 days supply | Qty: 30 | Fill #0

## 2019-10-30 MED FILL — busPIRone HCL 5 MG TABS: 5 | 30 days supply | Qty: 60 | Fill #1

## 2019-11-17 MED FILL — METFORMIN HCL ER 500 MG TB2: 500 | 90 days supply | Qty: 90 | Fill #0

## 2019-11-23 MED FILL — PREVIDENT 5000 BOOSTER PLUS: 1.1 | 30 days supply | Qty: 100 | Fill #1

## 2019-11-23 MED FILL — busPIRone HCL 5 MG TABS: 5 | 30 days supply | Qty: 60 | Fill #1

## 2019-12-04 HISTORY — PX: COLONOSCOPY: SHX174

## 2019-12-06 MED FILL — OZEMPIC 0.25 OR 0.5 MG/DOSE: 2 | 28 days supply | Qty: 2 | Fill #0

## 2019-12-12 MED FILL — TRI FEMYNOR 28 TABLET: 0.18/0.215/ | 84 days supply | Qty: 84 | Fill #0

## 2019-12-12 MED FILL — FLUCONAZOLE 150 MG TABLET: 150 | 3 days supply | Qty: 2 | Fill #0

## 2019-12-20 MED FILL — TRI FEMYNOR 28 TABLET: 0.18/0.215/ | 84 days supply | Qty: 84 | Fill #0

## 2019-12-20 MED FILL — FLUCONAZOLE 150 MG TABLET: 150 | 3 days supply | Qty: 2 | Fill #0

## 2019-12-25 MED FILL — HYDROCORTISONE 2.5% OINTMEN: 2.5 | 14 days supply | Qty: 40 | Fill #0

## 2019-12-27 IMAGING — XA DG FLUORO GUIDE NDL PLC/BX
1 series · 1 of 1 positions shown · non-contrast
Comparison: none

CLINICAL DATA: Gamekeeper's thumb.

[Series 1: ortho standard · 1 of 1 slices shown]
[im 1/1]
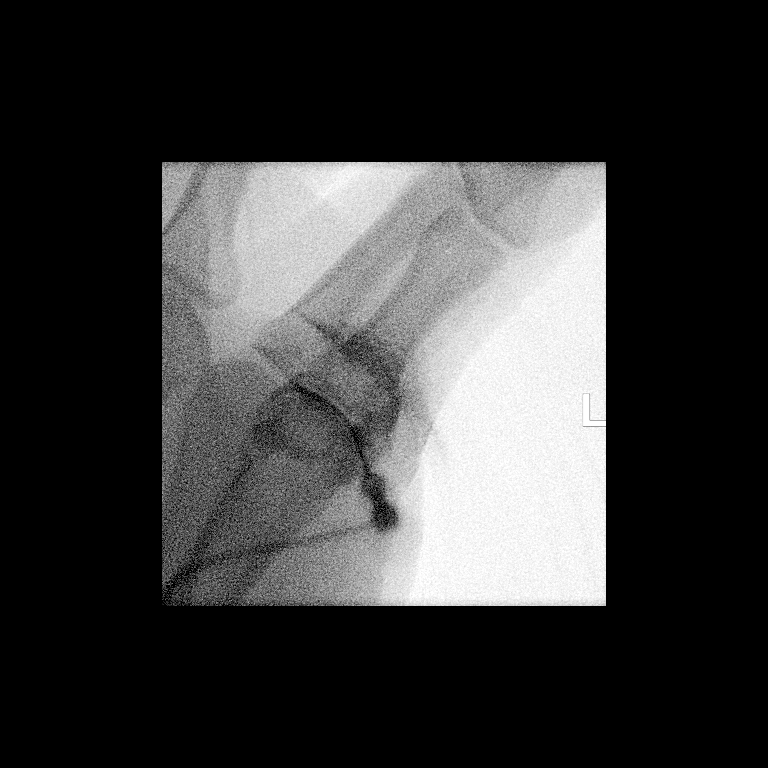

[1 of 1 positions shown; findings below may reference images not displayed]

FLUOROSCOPY TIME:  Radiation Exposure Index (as provided by the
fluoroscopic device): 0.1 mGy

Fluoroscopy Time:  8 seconds

Number of Acquired Images:  0

PROCEDURE:
The risks and benefits of the procedure were discussed with the
patient, and written informed consent was obtained. The patient
stated no history of allergy to contrast media. A formal timeout
procedure was performed with the patient according to departmental
protocol.

The patient was placed supine on the fluoroscopy table and the left
first MCP joint was identified under fluoroscopy. The skin overlying
the left first MCP joint was subsequently cleaned with Betadine and
a sterile drape was placed over the area of interest. 0.5 ml 1%
Lidocaine was used to anesthetize the skin around the needle
insertion site.

A 25 gauge needle was inserted into the left first MCP joint under
fluoroscopy.

1 ml of gadolinium mixture (0.1 ml of Multihance mixed with 10 ml of
Isovue-M 200 contrast and 10 ml of sterile saline) were injected
into the left first MCP joint.

The needle was removed and hemostasis was achieved. The patient was
subsequently transferred to MRI for imaging.
IMPRESSION: Technically successful left first MCP injection for MRI.

## 2020-02-01 ENCOUNTER — Other Ambulatory Visit (HOSPITAL_COMMUNITY): Payer: Self-pay | Admitting: Endocrinology

## 2020-02-01 MED FILL — METFORMIN HCL ER 500 MG TB2: 500 | 90 days supply | Qty: 90 | Fill #0

## 2020-02-01 MED FILL — OZEMPIC 0.25 OR 0.5 MG/DOSE: 2 | 28 days supply | Qty: 2 | Fill #0

## 2020-02-01 MED FILL — UNIFINE PENTIPS 32GX5/32: 32G X 4 MM | 90 days supply | Qty: 100 | Fill #0

## 2020-04-03 DIAGNOSIS — S5332XD Traumatic rupture of left ulnar collateral ligament, subsequent encounter: Secondary | ICD-10-CM | POA: Diagnosis not present

## 2020-04-11 DIAGNOSIS — N760 Acute vaginitis: Secondary | ICD-10-CM | POA: Diagnosis not present

## 2020-04-11 DIAGNOSIS — Z113 Encounter for screening for infections with a predominantly sexual mode of transmission: Secondary | ICD-10-CM | POA: Diagnosis not present

## 2020-04-24 DIAGNOSIS — L83 Acanthosis nigricans: Secondary | ICD-10-CM | POA: Diagnosis not present

## 2020-04-24 DIAGNOSIS — L731 Pseudofolliculitis barbae: Secondary | ICD-10-CM | POA: Diagnosis not present

## 2020-04-24 DIAGNOSIS — L304 Erythema intertrigo: Secondary | ICD-10-CM | POA: Diagnosis not present

## 2020-04-24 MED FILL — CLINDAMYCIN PH 1% SOLUTION: 1 | 30 days supply | Qty: 60 | Fill #0

## 2020-05-10 MED FILL — OZEMPIC 0.25 OR 0.5 MG/DOSE: 2 | 28 days supply | Qty: 2 | Fill #1

## 2020-05-10 MED FILL — TRIAMCIN 0.1%CR:SSD 1:1: 30 days supply | Qty: 220 | Fill #0

## 2020-05-18 ENCOUNTER — Emergency Department (HOSPITAL_COMMUNITY)
Admission: EM | Admit: 2020-05-18 | Discharge: 2020-05-18 | Disposition: A | Discharge status: Home / Self Care | Payer: BC Managed Care – PPO | Attending: Emergency Medicine | Admitting: Emergency Medicine

## 2020-05-18 ENCOUNTER — Emergency Department (HOSPITAL_COMMUNITY): Payer: BC Managed Care – PPO

## 2020-05-18 ENCOUNTER — Other Ambulatory Visit: Payer: Self-pay

## 2020-05-18 DIAGNOSIS — R000 Tachycardia, unspecified: Secondary | ICD-10-CM | POA: Diagnosis not present

## 2020-05-18 DIAGNOSIS — Z20822 Contact with and (suspected) exposure to covid-19: Secondary | ICD-10-CM | POA: Insufficient documentation

## 2020-05-18 DIAGNOSIS — J111 Influenza due to unidentified influenza virus with other respiratory manifestations: Secondary | ICD-10-CM | POA: Insufficient documentation

## 2020-05-18 DIAGNOSIS — E876 Hypokalemia: Secondary | ICD-10-CM | POA: Diagnosis not present

## 2020-05-18 DIAGNOSIS — I10 Essential (primary) hypertension: Secondary | ICD-10-CM | POA: Diagnosis not present

## 2020-05-18 DIAGNOSIS — R519 Headache, unspecified: Secondary | ICD-10-CM | POA: Diagnosis not present

## 2020-05-18 DIAGNOSIS — R0602 Shortness of breath: Secondary | ICD-10-CM | POA: Diagnosis not present

## 2020-05-18 DIAGNOSIS — R0789 Other chest pain: Secondary | ICD-10-CM | POA: Diagnosis not present

## 2020-05-18 DIAGNOSIS — E871 Hypo-osmolality and hyponatremia: Secondary | ICD-10-CM | POA: Insufficient documentation

## 2020-05-18 DIAGNOSIS — M791 Myalgia, unspecified site: Secondary | ICD-10-CM | POA: Diagnosis not present

## 2020-05-18 LAB — TROPONIN I (HIGH SENSITIVITY)
Troponin I (High Sensitivity): <2 ng/L (ref <18)
Troponin I (High Sensitivity): 2 ng/L (ref <18)

## 2020-05-18 LAB — BASIC METABOLIC PANEL
Anion gap: 12 (ref 5–15)
BUN: 7 mg/dL (ref 6–20)
CO2: 23 mmol/L (ref 22–32)
Calcium: 9.3 mg/dL (ref 8.9–10.3)
Chloride: 99 mmol/L (ref 98–111)
Creatinine, Ser: 0.91 mg/dL (ref 0.44–1.00)
GFR calc Af Amer: >60 mL/min (ref >60)
GFR calc non Af Amer: >60 mL/min (ref >60)
Glucose, Bld: 144 mg/dL — ABNORMAL HIGH (ref 70–99)
Potassium: 3.3 mmol/L — ABNORMAL LOW (ref 3.5–5.1)
Sodium: 134 mmol/L — ABNORMAL LOW (ref 135–145)

## 2020-05-18 LAB — HEPATIC FUNCTION PANEL
ALT: 19 U/L (ref 0–44)
AST: 18 U/L (ref 15–41)
Albumin: 3.7 g/dL (ref 3.5–5.0)
Alkaline Phosphatase: 63 U/L (ref 38–126)
Bilirubin, Direct: <0.1 mg/dL (ref 0.0–0.2)
Total Bilirubin: 0.5 mg/dL (ref 0.3–1.2)
Total Protein: 7.9 g/dL (ref 6.5–8.1)

## 2020-05-18 LAB — CBC
HCT: 41.9 % (ref 36.0–46.0)
Hemoglobin: 13.4 g/dL (ref 12.0–15.0)
MCH: 27.9 pg (ref 26.0–34.0)
MCHC: 32 g/dL (ref 30.0–36.0)
MCV: 87.1 fL (ref 80.0–100.0)
Platelets: 282 10*3/uL (ref 150–400)
RBC: 4.81 MIL/uL (ref 3.87–5.11)
RDW: 13 % (ref 11.5–15.5)
WBC: 8.1 10*3/uL (ref 4.0–10.5)
nRBC: 0 % (ref 0.0–0.2)

## 2020-05-18 LAB — I-STAT BETA HCG BLOOD, ED (MC, WL, AP ONLY): I-stat hCG, quantitative: <5 m[IU]/mL (ref <5)

## 2020-05-18 LAB — SARS CORONAVIRUS 2 BY RT PCR (HOSPITAL ORDER, PERFORMED IN ~~LOC~~ HOSPITAL LAB): SARS Coronavirus 2: NEGATIVE

## 2020-05-18 MED ORDER — PROCHLORPERAZINE EDISYLATE 10 MG/2ML IJ SOLN
10.0000 mg | Freq: Once | INTRAMUSCULAR | Status: AC
  Administered 2020-05-18: 06:00:00 10 mg via INTRAVENOUS
  Filled 2020-05-18: qty 2

## 2020-05-18 MED ORDER — POTASSIUM CHLORIDE CRYS ER 20 MEQ PO TBCR
40.0000 meq | EXTENDED_RELEASE_TABLET | Freq: Once | ORAL | Status: AC
  Administered 2020-05-18: 07:00:00 40 meq via ORAL
  Filled 2020-05-18: qty 2

## 2020-05-18 MED ORDER — SODIUM CHLORIDE 0.9 % IV BOLUS
1000.0000 mL | Freq: Once | INTRAVENOUS | Status: AC
  Administered 2020-05-18: 06:00:00 1000 mL via INTRAVENOUS

## 2020-05-18 MED ORDER — KETOROLAC TROMETHAMINE 30 MG/ML IJ SOLN
30.0000 mg | Freq: Once | INTRAMUSCULAR | Status: AC
  Administered 2020-05-18: 06:00:00 30 mg via INTRAVENOUS
  Filled 2020-05-18: qty 1

## 2020-05-18 NOTE — ED Provider Notes (Signed)
[facility] EMERGENCY DEPARTMENT Provider Note   CSN: [id number] Arrival date & time: 05/18/20  9983   History Chief Complaint  Patient presents with  . Generalized Body Aches    Catherine Hurley is a 24 y.o. female.  The history is provided by the patient.  She has history of polycystic ovarian syndrome and comes in with generalized malaise.  She started having a headache about a week ago.  She felt like this is a typical headache that she has at the onset of her menses.  She has been taking ibuprofen and acetaminophen which have only been giving temporary relief.  About 5 days ago, she started having chills and sweats and generalized body aches.  She has had anorexia and nausea and vomiting.  Notes has also been some diarrhea.  Today at work, she just felt too bad to continue.  She has not checked her temperature but thinks he may have been running a fever at home.  There has been a slight cough which is a chronic condition.  Cough is nonproductive.  She denies any change in sense of smell or taste.  She denies any sick contacts.  She has not received the Covid vaccination series.   Past Medical History:  Diagnosis Date  . Allergy   . Anxiety   . Autoimmune disorder (HCC)   . Depression   . Fibromyalgia   . PCOS (polycystic ovarian syndrome)     Patient Active Problem List   Diagnosis Date Noted  . 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 PCOS (polycystic ovarian syndrome) 08/14/2016  . Fibromyalgia 08/14/2016  . Positive ANA (antinuclear antibody) 08/14/2016    Past Surgical History:  Procedure Laterality Date  . tonsils and adnoids  2001     OB History   No obstetric history on file.     Family History  Problem Relation Age of Onset  . Diabetes Mother   . Hypertension Mother   . Diabetes Father   . Mental illness Father   . Diabetes Maternal Grandmother   . Hyperlipidemia Maternal Grandmother   . Hypertension  Maternal Grandmother   . Stroke Maternal Grandmother   . Heart attack Maternal Grandmother   . Lung cancer Maternal Grandmother        smoker  . Other Maternal Grandmother        renal  failure  . Cancer Paternal Grandmother        ovarian  . Stroke Paternal Grandmother   . Hypertension Paternal Grandmother   . Colon cancer Neg Hx   . Esophageal cancer Neg Hx   . Rectal cancer Neg Hx     Social History   Tobacco Use  . Smoking status: Never Smoker  . Smokeless tobacco: Never Used  Vaping Use  . Vaping Use: Never used  Substance Use Topics  . Alcohol use: Yes    Comment: rare  . Drug use: No    Home Medications Prior to Admission medications   Medication Sig Start Date End Date Taking? Authorizing Provider  acetaminophen-codeine (TYLENOL #3) 300-30 MG tablet Take 1 tablet by mouth every 6 (six) hours as needed. for pain 03/14/18   [provider]  benzonatate (TESSALON) 100 MG capsule Take 1 capsule (100 mg total) by mouth every 8 (eight) hours. 11/17/18   Bast, Gloris Manchester A, NP  HYDROcodone-homatropine (HYCODAN) 5-1.5 MG/5ML syrup Take 5 mLs by mouth at bedtime as needed. 03/22/18   Wallis Bamberg, PA-C  ibuprofen (  ADVIL,MOTRIN) 600 MG tablet Take 1 tablet (600 mg total) by mouth every 6 (six) hours as needed. 09/13/18   Wieters, Hallie C, PA-C  lidocaine (XYLOCAINE) 2 % solution Use as directed 15 mLs in the mouth or throat as needed for mouth pain. 11/17/18   Dahlia Byes A, NP  norethindrone (MICRONOR,CAMILA,ERRIN) 0.35 MG tablet Take 1 tablet by mouth daily. 12/14/17   [provider]    Allergies    Decadron [dexamethasone]  Review of Systems   Review of Systems  All other systems reviewed and are negative.   Physical Exam Updated Vital Signs BP (!) 153/111 (BP Location: Left Arm)   Pulse (!) 115   Temp 98.4 F (36.9 C) (Oral)   Resp 20   Ht 5\' 3"  (1.6 m)   Wt 82.6 kg   LMP 04/17/2020   SpO2 100%   BMI 32.24 kg/m   Physical Exam Vitals and  nursing note reviewed.   24 year old female, resting comfortably and in no acute distress. Vital signs are significant for elevated heart rate and blood pressure. Oxygen saturation is 100%, which is normal. Head is normocephalic and atraumatic. PERRLA, EOMI. Oropharynx is clear. Neck is nontender and supple without adenopathy or JVD. Back is nontender and there is no CVA tenderness. Lungs are clear without rales, wheezes, or rhonchi. Chest is nontender. Heart has regular rate and rhythm without murmur. Abdomen is soft, flat, nontender without masses or hepatosplenomegaly and peristalsis is normoactive. Extremities have no cyanosis or edema, full range of motion is present. Skin is warm and dry without rash. Neurologic: Mental status is normal, cranial nerves are intact, there are no motor or sensory deficits.  ED Results / Procedures / Treatments   Labs (all labs ordered are listed, but only abnormal results are displayed) Labs Reviewed  BASIC METABOLIC PANEL - Abnormal; Notable for the following components:      Result Value   Sodium 134 (*)    Potassium 3.3 (*)    Glucose, Bld 144 (*)    All other components within normal limits  SARS CORONAVIRUS 2 BY RT PCR (HOSPITAL ORDER, PERFORMED IN [facility])  CBC  HEPATIC FUNCTION PANEL  I-STAT BETA HCG BLOOD, ED ([facility], [facility], [facility] ONLY)  TROPONIN I (HIGH SENSITIVITY)  TROPONIN I (HIGH SENSITIVITY)    EKG EKG Interpretation  Date/Time:  Saturday May 18 2020 02:21:12 EDT Ventricular Rate:  119 PR Interval:  148 QRS Duration: 82 QT Interval:  312 QTC Calculation: 438 R Axis:   66 Text Interpretation: Sinus tachycardia Otherwise normal ECG No old tracing to compare Confirmed by [id number] (Dione Booze) on 05/18/2020 2:39:18 AM   Radiology DG Chest Portable 1 View  Result Date: 05/18/2020 CLINICAL DATA:  Shortness of breath and chest tightness x 4-5 days. EXAM: PORTABLE CHEST 1 VIEW COMPARISON:  August 14, 2016 FINDINGS:  There is no evidence of acute infiltrate, pleural effusion or pneumothorax. The heart size and mediastinal contours are within normal limits. The visualized skeletal structures are unremarkable. IMPRESSION: No active disease. Electronically Signed   By: August 16, 2016 M.D.   On: 05/18/2020 03:14    Procedures Procedures  Medications Ordered in ED Medications  ketorolac (TORADOL) 30 MG/ML injection 30 mg (has no administration in time range)  prochlorperazine (COMPAZINE) injection 10 mg (has no administration in time range)  potassium chloride SA (KLOR-CON) CR tablet 40 mEq (has no administration in time range)  sodium chloride 0.9 % bolus 1,000 mL (1,000 mLs  Intravenous New Bag/Given 05/18/20 0601)    ED Course  I have reviewed the triage vital signs and the nursing notes.  Pertinent labs & imaging results that were available during my care of the patient were reviewed by me and considered in my medical decision making (see chart for details).  MDM Rules/Calculators/A&P Influenza-like illness.  Patient is nontoxic in appearance, but is significantly tachycardic.  Headache may be related to subclinical dehydration.  Labs show mild hypokalemia and hyponatremia.  She is given a dose of oral potassium.  She will be given IV fluids, prochlorperazine, ketorolac and reassessed.  We will also check hepatic function studies.  Old records are reviewed, and she does have prior ED visits for upper respiratory infections.  Hepatic function panel is normal.  She feels much better after above-noted treatment.  She is reassured of the benign nature of her condition and is discharged with instructions to continue using over-the-counter analgesics as needed for pain.  Given a work release for the next 2 days.  Catherine Hurley was evaluated in Emergency Department on 05/18/2020 for the symptoms described in the history of present illness. She was evaluated in the context of the global COVID-19 pandemic, which  necessitated consideration that the patient might be at risk for infection with the SARS-CoV-2 virus that causes COVID-19. Institutional protocols and algorithms that pertain to the evaluation of patients at risk for COVID-19 are in a state of rapid change based on information released by regulatory bodies including the CDC and federal and state organizations. These policies and algorithms were followed during the patient's care in the ED.  Final Clinical Impression(s) / ED Diagnoses Final diagnoses:  Influenza-like illness  Bad headache  Hypokalemia  Hyponatremia    Rx / DC Orders ED Discharge Orders    None       Dione Booze, MD 05/18/20 0730

## 2020-05-18 NOTE — Discharge Instructions (Signed)
Drink plenty of fluids.  Continue taking ibuprofen and acetaminophen as needed.

## 2020-05-18 NOTE — ED Triage Notes (Signed)
Pt c/o generalized body aches, chills, and a HA.

## 2020-05-21 MED FILL — busPIRone HCL 5 MG TABS: 5 | 30 days supply | Qty: 60 | Fill #2

## 2020-06-10 DIAGNOSIS — R634 Abnormal weight loss: Secondary | ICD-10-CM | POA: Diagnosis not present

## 2020-06-10 DIAGNOSIS — R590 Localized enlarged lymph nodes: Secondary | ICD-10-CM | POA: Diagnosis not present

## 2020-06-10 DIAGNOSIS — Z6830 Body mass index (BMI) 30.0-30.9, adult: Secondary | ICD-10-CM | POA: Diagnosis not present

## 2020-06-10 DIAGNOSIS — R110 Nausea: Secondary | ICD-10-CM | POA: Diagnosis not present

## 2020-06-10 DIAGNOSIS — R599 Enlarged lymph nodes, unspecified: Secondary | ICD-10-CM | POA: Diagnosis not present

## 2020-06-10 DIAGNOSIS — R5383 Other fatigue: Secondary | ICD-10-CM | POA: Diagnosis not present

## 2020-07-05 MED FILL — OZEMPIC 0.25 OR 0.5 MG/DOSE: 2 | 28 days supply | Qty: 2 | Fill #2

## 2020-07-12 ENCOUNTER — Ambulatory Visit: Payer: BC Managed Care – PPO | Attending: Family

## 2020-07-12 DIAGNOSIS — Z23 Encounter for immunization: Secondary | ICD-10-CM

## 2020-07-25 DIAGNOSIS — L731 Pseudofolliculitis barbae: Secondary | ICD-10-CM | POA: Diagnosis not present

## 2020-07-25 DIAGNOSIS — L83 Acanthosis nigricans: Secondary | ICD-10-CM | POA: Diagnosis not present

## 2020-07-25 DIAGNOSIS — L74510 Primary focal hyperhidrosis, axilla: Secondary | ICD-10-CM | POA: Diagnosis not present

## 2020-07-25 DIAGNOSIS — L304 Erythema intertrigo: Secondary | ICD-10-CM | POA: Diagnosis not present

## 2020-07-25 MED FILL — GLYCOPYRROLATE 2 MG TABS: 2 | 30 days supply | Qty: 30 | Fill #0

## 2020-07-26 MED FILL — OZEMPIC 0.25 OR 0.5 MG/DOSE: 2 | 28 days supply | Qty: 2 | Fill #2

## 2020-07-30 NOTE — Progress Notes (Signed)
   Covid-19 Vaccination Clinic  Name:  Catherine Hurley    MRN: [id number] DOB: Jun 25, 1996  07/30/2020  [name] was observed post Covid-19 immunization for 15 minutes without incident. She was provided with Vaccine Information Sheet and instruction to access the V-Safe system.   [name] was instructed to call 911 with any severe reactions post vaccine: Marland Kitchen Difficulty breathing  . Swelling of face and throat  . A fast heartbeat  . A bad rash all over body  . Dizziness and weakness   Immunizations Administered    Name Date Dose VIS Date Route   Pfizer COVID-19 Vaccine 07/12/2020 11:05 AM 0.3 mL 12/27/2018 Intramuscular   Manufacturer: [employer], [employer]   Lot: [id number]   NDC: [address]-7

## 2020-08-02 ENCOUNTER — Ambulatory Visit: Payer: BC Managed Care – PPO | Attending: Family

## 2020-08-02 DIAGNOSIS — Z23 Encounter for immunization: Secondary | ICD-10-CM

## 2020-09-09 NOTE — Progress Notes (Signed)
° °  Covid-19 Vaccination Clinic  Name:  Catherine Hurley    MRN: [id number] DOB: 03-Nov-1995  09/09/2020  [name] was observed post Covid-19 immunization for 15 minutes without incident. She was provided with Vaccine Information Sheet and instruction to access the V-Safe system.   [name] was instructed to call 911 with any severe reactions post vaccine:  Difficulty breathing   Swelling of face and throat   A fast heartbeat   A bad rash all over body   Dizziness and weakness   Immunizations Administered    Name Date Dose VIS Date Route   Pfizer COVID-19 Vaccine 08/02/2020  4:30 PM 0.3 mL 08/21/2020 Intramuscular   Manufacturer: [employer], [employer]   Lot: [id number]   NDC: [address]-9

## 2020-09-16 DIAGNOSIS — R700 Elevated erythrocyte sedimentation rate: Secondary | ICD-10-CM | POA: Diagnosis not present

## 2020-09-16 DIAGNOSIS — M2550 Pain in unspecified joint: Secondary | ICD-10-CM | POA: Diagnosis not present

## 2020-09-16 DIAGNOSIS — R768 Other specified abnormal immunological findings in serum: Secondary | ICD-10-CM | POA: Diagnosis not present

## 2020-09-16 DIAGNOSIS — R5381 Other malaise: Secondary | ICD-10-CM | POA: Diagnosis not present

## 2020-09-16 DIAGNOSIS — R5383 Other fatigue: Secondary | ICD-10-CM | POA: Diagnosis not present

## 2020-10-23 MED FILL — OZEMPIC 0.25 OR 0.5 MG/DOSE: 2 | 28 days supply | Qty: 2 | Fill #3

## 2021-03-27 ENCOUNTER — Other Ambulatory Visit (HOSPITAL_COMMUNITY): Payer: Self-pay

## 2021-03-27 MED ORDER — TRULICITY 0.75 MG/0.5ML ~~LOC~~ SOAJ
0.7500 mg | SUBCUTANEOUS | 0 refills | Status: AC
  Filled 2021-03-27 – 2021-04-01 (×3): qty 2, 28d supply, fill #0

## 2021-04-01 ENCOUNTER — Other Ambulatory Visit (HOSPITAL_COMMUNITY): Payer: Self-pay

## 2021-04-07 ENCOUNTER — Other Ambulatory Visit (HOSPITAL_COMMUNITY): Payer: Self-pay

## 2021-04-30 ENCOUNTER — Other Ambulatory Visit (HOSPITAL_COMMUNITY): Payer: Self-pay

## 2021-04-30 MED ORDER — BUSPIRONE HCL 10 MG PO TABS
10.0000 mg | ORAL_TABLET | Freq: Two times a day (BID) | ORAL | 5 refills | Status: AC
  Filled 2021-04-30: qty 60, 30d supply, fill #0

## 2021-05-01 ENCOUNTER — Other Ambulatory Visit (HOSPITAL_COMMUNITY): Payer: Self-pay

## 2021-05-02 ENCOUNTER — Other Ambulatory Visit (HOSPITAL_COMMUNITY): Payer: Self-pay

## 2021-05-06 ENCOUNTER — Other Ambulatory Visit (HOSPITAL_COMMUNITY): Payer: Self-pay

## 2021-05-06 MED ORDER — LISINOPRIL 5 MG PO TABS
5.0000 mg | ORAL_TABLET | Freq: Every day | ORAL | 1 refills | Status: AC
  Filled 2021-05-06: qty 30, 30d supply, fill #0

## 2021-05-06 MED ORDER — TRULICITY 0.75 MG/0.5ML ~~LOC~~ SOAJ
0.7500 mg | SUBCUTANEOUS | 5 refills | Status: DC
  Filled 2021-05-06: qty 2, 28d supply, fill #0
  Filled 2021-06-06 (×2): qty 2, 28d supply, fill #1

## 2021-06-06 ENCOUNTER — Other Ambulatory Visit (HOSPITAL_COMMUNITY): Payer: Self-pay

## 2021-10-13 ENCOUNTER — Other Ambulatory Visit (HOSPITAL_COMMUNITY): Payer: Self-pay

## 2021-10-13 MED ORDER — GLUCOSE BLOOD VI STRP
ORAL_STRIP | 5 refills | Status: AC
  Filled 2021-10-13: qty 100, 50d supply, fill #0

## 2021-10-14 ENCOUNTER — Other Ambulatory Visit (HOSPITAL_COMMUNITY): Payer: Self-pay

## 2021-10-14 MED ORDER — OZEMPIC (0.25 OR 0.5 MG/DOSE) 2 MG/1.5ML ~~LOC~~ SOPN
0.5000 mg | PEN_INJECTOR | SUBCUTANEOUS | 5 refills | Status: AC
  Filled 2021-10-14: qty 1.5, 28d supply, fill #0
  Filled 2021-10-14: qty 1.5, 56d supply, fill #0

## 2021-10-15 ENCOUNTER — Other Ambulatory Visit (HOSPITAL_COMMUNITY): Payer: Self-pay

## 2021-10-15 MED ORDER — INSULIN PEN NEEDLE 32G X 4 MM MISC
1 refills | Status: AC
  Filled 2021-10-15: qty 100, 30d supply, fill #0

## 2021-10-15 MED ORDER — INSULIN DEGLUDEC 100 UNIT/ML ~~LOC~~ SOPN
15.0000 [IU] | PEN_INJECTOR | Freq: Every day | SUBCUTANEOUS | 2 refills | Status: AC
  Filled 2021-10-15 (×2): qty 12, 80d supply, fill #0

## 2021-10-18 ENCOUNTER — Other Ambulatory Visit (HOSPITAL_COMMUNITY): Payer: Self-pay

## 2022-01-07 NOTE — Progress Notes (Signed)
? ? ? ? ?  SUBJECTIVE: Catherine Hurley is a 26 y.o. female here for STI screen and to r/o vaginal yeast infection. IDDM, recently on antibiotics, has noticed some d/c. No longer using OCPs, using condoms inconsistently.  ? ?Review of Systems  ?Genitourinary:  Positive for vaginal discharge.  ?All other systems reviewed and are negative.  ? ?Objective: ? ?-Vulva: without lesions or discharge ?-Vagina: discharge present, aptima swab and wet prep obtained ?-Cervix: no lesion or discharge, no CMT ?-Perineum: no lesions ?-Uterus: Mobile, non tender ?-Adnexa: no masses or tenderness ? ? ?Microscopic wet-mount exam shows negative for pathogens, normal epithelial cells.  ? ?Chaperone offered and declined. ? ?Assessment: ?1. Screen for STD (sexually transmitted disease) ? ?- C. trachomatis/N. gonorrhoeae RNA ?- HIV antibody (with reflex) ?- RPR ?- Hepatitis C antibody ? ?2. Encounter for initial prescription of other contraceptives ?Discussed all options, would like to try phexxi ? ?- Lactic Ac-Citric Ac-Pot Bitart (PHEXXI) 1.8-1-0.4 % GEL; Place 5 g vaginally as directed.  Dispense: 60 g; Refill: 11  ? ?Plan: Will contact patient with results of testing completed today. Avoid intercourse until symptoms are resolved. Safe sex encouraged. Avoid the use of soaps or perfumed products in the peri area. Avoid tub baths and sitting in sweaty or wet clothing for prolonged periods of time.   ?

## 2022-01-08 ENCOUNTER — Encounter: Payer: Self-pay | Admitting: Radiology

## 2022-01-08 ENCOUNTER — Ambulatory Visit: Payer: BC Managed Care – PPO | Admitting: Radiology

## 2022-01-08 ENCOUNTER — Other Ambulatory Visit: Payer: Self-pay

## 2022-01-08 VITALS — BP 132/94 | Ht 63.0 in

## 2022-01-08 DIAGNOSIS — Z30018 Encounter for initial prescription of other contraceptives: Secondary | ICD-10-CM | POA: Diagnosis not present

## 2022-01-08 DIAGNOSIS — Z113 Encounter for screening for infections with a predominantly sexual mode of transmission: Secondary | ICD-10-CM

## 2022-01-08 LAB — WET PREP FOR TRICH, YEAST, CLUE

## 2022-01-08 MED ORDER — PHEXXI 1.8-1-0.4 % VA GEL: 5.0000 g | VAGINAL | 11 refills | Status: AC

## 2022-01-08 NOTE — Addendum Note (Signed)
Addended by: Raynelle Fanning C on: 01/08/2022 11:18 AM ? ? Modules accepted: Orders ? ?

## 2022-01-09 LAB — HEPATITIS C ANTIBODY
Hepatitis C Ab: NONREACTIVE
SIGNAL TO CUT-OFF: 0.16 (ref <1.00)

## 2022-01-09 LAB — HIV ANTIBODY (ROUTINE TESTING W REFLEX): HIV 1&2 Ab, 4th Generation: NONREACTIVE

## 2022-01-09 LAB — C. TRACHOMATIS/N. GONORRHOEAE RNA
C. trachomatis RNA, TMA: NOT DETECTED
N. gonorrhoeae RNA, TMA: NOT DETECTED

## 2022-01-09 LAB — RPR: RPR Ser Ql: NONREACTIVE

## 2022-03-02 DIAGNOSIS — S80212A Abrasion, left knee, initial encounter: Secondary | ICD-10-CM | POA: Diagnosis not present

## 2022-03-02 DIAGNOSIS — M25562 Pain in left knee: Secondary | ICD-10-CM | POA: Diagnosis not present

## 2023-01-14 ENCOUNTER — Ambulatory Visit: Payer: BC Managed Care – PPO | Admitting: Internal Medicine

## 2023-01-14 ENCOUNTER — Encounter: Payer: Self-pay | Admitting: Internal Medicine

## 2023-01-14 VITALS — BP 145/92 | HR 89 | Ht 63.5 in | Wt 180.0 lb

## 2023-01-14 DIAGNOSIS — E1165 Type 2 diabetes mellitus with hyperglycemia: Secondary | ICD-10-CM

## 2023-01-14 MED ORDER — LANCETS MISC
1.0000 | Freq: Four times a day (QID) | 3 refills | Status: AC
Start: 2023-01-14 — End: ?

## 2023-01-14 MED ORDER — INSULIN GLARGINE 100 UNIT/ML SC SOPN
15.0000 [IU] | PEN_INJECTOR | Freq: Every evening | SUBCUTANEOUS | 11 refills | Status: AC
Start: 2023-01-14 — End: ?

## 2023-01-14 MED ORDER — OZEMPIC (1 MG/DOSE) 4 MG/3ML SC SOPN
1.0000 mg | PEN_INJECTOR | SUBCUTANEOUS | Status: DC
Start: 2022-01-16 — End: 2023-01-14

## 2023-01-14 MED ORDER — INSULIN PEN NEEDLE 32G X 4 MM MISC
11 refills | Status: DC
Start: 2023-01-14 — End: 2023-11-11

## 2023-01-14 MED ORDER — GLUCOSE BLOOD VI STRP
1.0000 | ORAL_STRIP | Freq: Four times a day (QID) | 3 refills | Status: AC
Start: 2023-01-14 — End: ?

## 2023-01-14 MED ORDER — SEMAGLUTIDE (1 MG/DOSE) 4 MG/3ML SC SOPN
1.0000 mg | PEN_INJECTOR | SUBCUTANEOUS | 11 refills | Status: AC
Start: 2023-01-14 — End: ?

## 2023-01-14 MED ORDER — FREESTYLE LIBRE 2 SENSOR MISC
3 refills | Status: AC
Start: 2023-01-14 — End: ?

## 2023-01-14 MED ORDER — INSULIN LISPRO (1 UNIT DIAL) 100 UNIT/ML SC SOPN
4.0000 [IU] | PEN_INJECTOR | Freq: Three times a day (TID) | SUBCUTANEOUS | 11 refills | Status: DC
Start: 2023-01-14 — End: 2023-08-06

## 2023-01-14 MED ORDER — INSULIN GLARGINE-YFGN 100 UNIT/ML SC SOPN
15.0000 [IU] | PEN_INJECTOR | SUBCUTANEOUS | Status: DC
Start: 2022-03-06 — End: 2023-08-06

## 2023-01-14 NOTE — Consults (Signed)
ENDOCRINOLOGY CONSULT NOTE    Date of Evaluation: 01/14/23      Referring Physician: No Pcp, Per Patient    Reason(s) for Referral: DM    History of Present Illness:  Alexa Jones is a 27 year old female with a PMH significant for DM here as a new patient.    PCOS diagnosed at the age of 65  Used metformin but had GI side effects and stopped    Afterwards had prediabetes and then diabetes    Went to ED recently at [place]. 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  She presented to the emergency department with polyuria and some blurry vision   BG was 347 in BMP  She received IV hydration and subcutaneous insulin.   She was discharged home with prescriptions provided for Ozempic and her insulin.     For the last week:  Taking Glargine 8-10 units nightly  Also injected Ozempic '1mg'$  as well      Yesterday UA shows ketones and BG in urine    Blood glucose log:  Fasting AM: No  AC dinner: 169 yesterday      Diet  Normally eats 2-3 meals  Breakfast: cereal, churrios, jelly sandwich, banana  Lunch:soup, salad  Dinner: chicken  Snacks: crackers  Drinks: water, grape juice    Exercise: twice a week doing cardio      Complications  Retinopathy. Will see phthalmology in 02/2023  Nephropathy: normal eGFR  Neuropathy: None  CVD: None.    Weight 81.6         ROS negative except for mentioned above.     Past Medical History;  No past medical history on file.    Family History:  No family history on file.    Social History:  Social History     Socioeconomic History    Marital status: Single     Spouse name: Not on file    Number of children: Not on file    Years of education: Not on file    Highest education level: Not on file   Occupational History    Not on file   Tobacco Use    Smoking status: Never    Smokeless tobacco: Never   Substance and Sexual Activity    Alcohol use: Yes     Comment: occasionally    Drug use: Never    Sexual activity: Not on file   Other Topics Concern    Not on file   Social History Narrative    Not on  file     Social Determinants of Health     Financial Resource Strain: Not on file   Food Insecurity: Not on file   Transportation Needs: Not on file   Physical Activity: Not on file   Stress: Not on file   Social Connections: Not on file   Intimate Partner Violence: Not on file   Housing Stability: Not on file       Allergies:  Allergies   Allergen Reactions    Dexamethasone Anaphylaxis and Shortness of Breath       Medications:  Outpatient Medications Marked as Taking for the 01/14/23 encounter (Office Visit) with Aaron Boeh, Lacinda Axon, MD   Medication Sig Dispense Refill    insulin glargine-yfgn (SEMGLEE) 100 UNIT/ML injection pen Inject 15 Units under the skin.      semaglutide, 1 MG/DOSE, (OZEMPIC, 1 MG/DOSE,) 4 mg/56m Inject 1 mg under the skin every 7 days.  Physical Exam:  Vitals: BP (!) 145/92   Pulse 89   Ht 5' 3.5" (1.613 m)   Wt 81.6 kg (180 lb)   SpO2 99%   BMI 31.39 kg/m    Gen: pleasant, NAD  Eyes: anicteric sclerae, no proptosis  Neck: no palpable thyromegaly  CV: normal rate  Pulm: equal chest rise and fall b/l  Abd: soft, NT  Ext: warm, no edema, no tremor  Skin: warm and dry  Neuro: grossly intact    Diagnostic Data:  No results found for: "NA", "K", "CL", "BICARB", "BUN", "CREAT", "GLU", "[place]"  No results found for: "BUN", "CREAT", "CL", "NA", "K", "[place]", "TBILI", "ALB", "TP", "AST", "ALK", "BICARB", "ALT", "GLU"  No results found for: "A1C"  No results found for: "TSH", "FREET4", "T3"  No results found for: "VITD25HYDROX", "VD2", "VD3", "VDT"  No results found for: "PTHINTACT"  No results found for: "GLUPOC"      Assessment and Plan:  Alexa Jones is a 27 year old female with a PMH significant for DM here as a new patient.    #DM  Uncontrolled. Not compliant with meds. Had recent ED visit for polyuria and blurry vision. BG in 300s. Resumed Ozempic and Insulin since ED visit. Could not tolerate metformin in the past.  - Take glargine 15 units nightly  - Take lispro 4-5 units TID AC  - Take  ozempic 1 mg weekly  - Check A1c, CMP, lipid panel, Urine Microalbumin Creatinine ratio, c-peptide  - Discussed glucose monitoring  - Pt instructed to check blood glucose fasting and 2 hr after meals  - Goal blood sugars as recommended by the American Diabetes Association were reviewed with the patient. Goal is to be 80-130 before meals and less than 180 two hours after meals.  - Typical A1c goal for diabetes is to be less than 7.0, but A1c goal needs to be tailored to the patient based on duration of diabetes, age and co-morbidities, hypoglycemia risk and risk for diabetes-related complications  - Discussed the risks of uncontrolled hyperglycemia.  - Discussed risk of long-term microvascular complications in relation to diabetes control /HbA1C  - Reviewed dietary modifications and encourage exercise as tolerated  - Risks/benefits/side effects/alternatives/precautions of meds reviewed  - Hypoglycemia protocol/precautions reviewed. Carry glucose tabs at all times.  - Check glucose prior to driving or partaking in activities that can adversely affect self or others if patient becomes impaired due to glucose fluctuation. Patient understands risks/precautions.  - Annual ophthalmic exam and regular diabetic foot care encouraged  - Pt to notify us between visits for issues with hypo/hyperglycemia    RTC in 1 month    Total of 48 minutes were spent on this visit which includes some or all of the following components: face-to-face time, non-face-to-face time, preparation to see the patient, review of tests, obtaining and reviewing separately obtained history, performing a medically appropriate examination and/or evaluation, counseling and educating the patient/family/caregiver, ordering medications, tests, or procedures, referring and communicating with other healthcare professionals, documenting clinical information in the EHR and independently interpreting results and communicating results to patient. The above  recommendations were discussed with the patient. The patient has all questions answered satisfactorily and agrees with this recommended plan of care. The patient and family or caregiver's questions were answered to their satisfaction. They know how to contact us if they have additional questions.          Roda Shutters, MD, PhD, [facility]  Assistant Clinical Professor  Division of Endocrinology  [employer]

## 2023-01-15 ENCOUNTER — Ambulatory Visit: Payer: BC Managed Care – PPO | Admitting: Radiology

## 2023-02-01 ENCOUNTER — Telehealth: Payer: Self-pay | Admitting: Internal Medicine

## 2023-02-01 NOTE — Telephone Encounter (Signed)
Patient calling to follow up on the authorization for the following medication:    RX: semaglutide, 1 MG/DOSE, (OZEMPIC) 4 mg/64mL     Please call to further assist.

## 2023-02-02 NOTE — Telephone Encounter (Signed)
Returned the pt's call, no answer. Left a detailed message.  Prior Josem Kaufmann was submitted on Cover mymeds.    Leigha Olberding S MA.

## 2023-02-09 ENCOUNTER — Ambulatory Visit (INDEPENDENT_AMBULATORY_CARE_PROVIDER_SITE_OTHER): Payer: PRIVATE HEALTH INSURANCE | Admitting: Optometrist

## 2023-02-09 ENCOUNTER — Encounter: Payer: Self-pay | Admitting: Optometrist

## 2023-02-09 DIAGNOSIS — H5213 Myopia, bilateral: Secondary | ICD-10-CM

## 2023-02-09 DIAGNOSIS — H5231 Anisometropia: Secondary | ICD-10-CM

## 2023-02-09 DIAGNOSIS — H52203 Unspecified astigmatism, bilateral: Secondary | ICD-10-CM

## 2023-02-09 DIAGNOSIS — E119 Type 2 diabetes mellitus without complications: Secondary | ICD-10-CM

## 2023-02-09 NOTE — Progress Notes (Signed)
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 She is a DM T2 with a Hx of a one time A1c of "10". Her ANISOMETROPIC Rx for glasses is:   Ophthalmology Exam       Final Rx         Sphere Cylinder Axis Dist [facility] [place]    Right -4.00 Sphere  20/25- J1-    Left -0.75 +0.50 095 20/20+ J1+      Type: SVL    Comments: Anti-reflective, Polycarbonate              Edited by: Otis Brace             Good ocular health, color vision, confrontation fields, binocularity and motility. NO BDR noted on examination of anterior or posterior parts of the eye.  Excellent stereopsis.Blood Sugar control discussed and stressed for better control of her A1c.  RTC yearly for annual exam with MRx, IOPs and DFE.

## 2023-02-15 NOTE — Telephone Encounter (Signed)
Prior Berkley Harvey was denied. The pt has never called back.    Mohan Erven S MA

## 2023-02-25 ENCOUNTER — Ambulatory Visit: Payer: BC Managed Care – PPO | Admitting: Internal Medicine

## 2023-02-25 ENCOUNTER — Telehealth: Payer: BC Managed Care – PPO | Admitting: Internal Medicine

## 2023-02-25 DIAGNOSIS — E1165 Type 2 diabetes mellitus with hyperglycemia: Secondary | ICD-10-CM

## 2023-02-25 NOTE — Progress Notes (Signed)
This telemedicine visit was conducted Audio+Video. The patient was in [place] for the duration of the visit.  The patient consented to a virtual visit, understanding the risks, such as a limited examination, and was aware that an in-person visit was available to them. Total time spent was 21+ minutes.      ENDOCRINOLOGY F/U NOTE    Date of Evaluation: 02/25/2023     Referring Physician: Vianne Bulls    Reason(s) for Referral: DM    History of Present Illness:  Alexa Jones is a 27 year old female with a PMH significant for DM here as a new patient.    PCOS diagnosed at the age of 69  Used metformin but had GI side effects and stopped    Afterwards had prediabetes and then diabetes    Went to ED recently at [place]. 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  She presented to the emergency department with polyuria and some blurry vision   BG was 347 in BMP  She received IV hydration and subcutaneous insulin.   She was discharged home with prescriptions provided for Ozempic and her insulin.        Interval History  LV 01/14/23     No recent labs done    CURRENT MEDS  glargine 10 units nightly (decreased from 15 units since it was making her feel not good)  Lispro 2 units TID AC  Did not start ozempic yet    Blood glucose log:  Fasting AM: 144, 146, 159  AC lunch: 159, 174,191  AC dinner: 188, 118, 162, 94    Biggest meal is lunch    Diet  Normally eats 2-3 meals  Breakfast: cereal, churrios, jelly sandwich, banana  Lunch:soup, salad  Dinner: chicken  Snacks: crackers  Drinks: water, grape juice    Exercise: increased cardio exercise      Complications  Retinopathy. Will see phthalmology in 02/2023  Nephropathy: normal eGFR  Neuropathy: None  CVD: None.      ROS negative except for mentioned above.     Past Medical History;  No past medical history on file.    Family History:  Family History   Problem Relation Name Age of Onset    Cataract M Grandmother      Cataract M Grandfather      Cataract P Grandmother          Social History:  Social History     Socioeconomic History    Marital status: Single     Spouse name: Not on file    Number of children: Not on file    Years of education: Not on file    Highest education level: Not on file   Occupational History    Not on file   Tobacco Use    Smoking status: Never    Smokeless tobacco: Never   Substance and Sexual Activity    Alcohol use: Yes     Comment: occasionally    Drug use: Never    Sexual activity: Not on file   Other Topics Concern    Not on file   Social History Narrative    Not on file     Social Determinants of Health     Financial Resource Strain: Not on file   Food Insecurity: Not on file   Transportation Needs: Not on file   Physical Activity: Not on file   Stress: Not on file   Social Connections: Not on file   Intimate Partner  Violence: Not on file   Housing Stability: Not on file       Allergies:  Allergies   Allergen Reactions    Dexamethasone Anaphylaxis and Shortness of Breath       Medications:  No outpatient medications have been marked as taking for the 02/25/23 encounter (Appointment) with Alexa Jones, Joelyn Oms, MD.       Physical Exam:  deferred    Diagnostic Data:  No results found for: "NA", "K", "CL", "BICARB", "BUN", "CREAT", "GLU", "[place]"  No results found for: "BUN", "CREAT", "CL", "NA", "K", "[place]", "TBILI", "ALB", "TP", "AST", "ALK", "BICARB", "ALT", "GLU"  No results found for: "A1C"  No results found for: "TSH", "FREET4", "T3"  No results found for: "VITD25HYDROX", "VD2", "VD3", "VDT"  No results found for: "PTHINTACT"  No results found for: "GLUPOC"      Assessment and Plan:  Alexa Jones is a 27 year old female with a PMH significant for DM here as a follow up.    #DM  No recent labs done. Could not tolerate metformin in the past.  Both fasting and postprandial BG elevated. Ozempic not given due to A1c not done.  - Increase glargine from 10 units to 12 units nightly  - Increase lispro from 2 units to 4 units TID AC  - If approved, start ozempic  1 mg weekly  - Check A1c, CMP, lipid panel, Urine Microalbumin Creatinine ratio, c-peptide  - Discussed glucose monitoring  - Pt instructed to check blood glucose fasting and 2 hr after meals  - Goal blood sugars as recommended by the American Diabetes Association were reviewed with the patient. Goal is to be 80-130 before meals and less than 180 two hours after meals.  - Typical A1c goal for diabetes is to be less than 7.0, but A1c goal needs to be tailored to the patient based on duration of diabetes, age and co-morbidities, hypoglycemia risk and risk for diabetes-related complications  - Discussed the risks of uncontrolled hyperglycemia.  - Discussed risk of long-term microvascular complications in relation to diabetes control /HbA1C  - Reviewed dietary modifications and encourage exercise as tolerated  - Risks/benefits/side effects/alternatives/precautions of meds reviewed  - Hypoglycemia protocol/precautions reviewed. Carry glucose tabs at all times.  - Check glucose prior to driving or partaking in activities that can adversely affect self or others if patient becomes impaired due to glucose fluctuation. Patient understands risks/precautions.  - Annual ophthalmic exam and regular diabetic foot care encouraged  - Pt to notify us between visits for issues with hypo/hyperglycemia    RTC in 6 weeks    Total of 36 minutes were spent on this visit which includes some or all of the following components: face-to-face time, non-face-to-face time, preparation to see the patient, review of tests, obtaining and reviewing separately obtained history, performing a medically appropriate examination and/or evaluation, counseling and educating the patient/family/caregiver, ordering medications, tests, or procedures, referring and communicating with other healthcare professionals, documenting clinical information in the EHR and independently interpreting results and communicating results to patient. The above recommendations  were discussed with the patient. The patient has all questions answered satisfactorily and agrees with this recommended plan of care. The patient and family or caregiver's questions were answered to their satisfaction. They know how to contact us if they have additional questions.          Vianne Bulls, MD, PhD, [facility]  Assistant Clinical Professor  Division of Endocrinology  [place] [facility] [place], [place]

## 2023-08-05 ENCOUNTER — Telehealth: Payer: Self-pay | Admitting: Internal Medicine

## 2023-08-05 NOTE — Telephone Encounter (Signed)
Patient calling requesting a lab order to be sent to [facility].    Please call patient to further assist.

## 2023-08-06 ENCOUNTER — Ambulatory Visit: Payer: BC Managed Care – PPO | Admitting: Student in an Organized Health Care Education/Training Program

## 2023-08-06 DIAGNOSIS — E1165 Type 2 diabetes mellitus with hyperglycemia: Secondary | ICD-10-CM

## 2023-08-06 MED ORDER — INSULIN LISPRO (1 UNIT DIAL) 100 UNIT/ML SC SOPN
4.0000 [IU] | PEN_INJECTOR | Freq: Three times a day (TID) | SUBCUTANEOUS | 11 refills | Status: AC
Start: 2023-08-06 — End: ?

## 2023-08-06 MED ORDER — LANTUS SOLOSTAR 100 UNIT/ML SC SOLN
15.0000 [IU] | PEN_INJECTOR | Freq: Every day | SUBCUTANEOUS | 3 refills | Status: AC
Start: 2023-08-06 — End: ?

## 2023-08-06 MED ORDER — SEMAGLUTIDE(0.25 OR 0.5MG/DOS) 2 MG/3ML SC SOPN
PEN_INJECTOR | SUBCUTANEOUS | 3 refills | Status: DC
Start: 2023-08-06 — End: 2023-08-20

## 2023-08-06 MED ORDER — INSULIN GLARGINE-YFGN 100 UNIT/ML SC SOPN
15.0000 [IU] | PEN_INJECTOR | Freq: Every evening | SUBCUTANEOUS | 3 refills | Status: DC
Start: 2023-08-06 — End: 2023-08-06

## 2023-08-06 NOTE — Telephone Encounter (Signed)
Good morning Dr. Kirtland Bouchard.  Please assist.    Thurnell Lose MA.

## 2023-08-06 NOTE — Progress Notes (Signed)
This telemedicine visit was conducted Audio+Video. The patient was in [place] for the duration of the visit.  The patient consented to a virtual visit, understanding the risks, such as a limited examination, and was aware that an in-person visit was available to them. Total time spent was 11-20 minutes.     ENDOCRINOLOGY F/U NOTE    Date of Evaluation:  08/06/2023  LV: 02/25/2023    History of Present Illness:  Alexa Jones is a 27 year old female with a PMH significant for DM here for urgent f/u for insulin refills.   Prev seen by Dr. Kristeen Mans.     PCOS diagnosed at the age of 16  Used metformin but had GI side effects and stopped    Afterwards had prediabetes and then diabetes    Went to ED 01/2023 at [facility]. 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  She presented to the emergency department with polyuria and some blurry vision   BG was 347 in BMP  She received IV hydration and subcutaneous insulin.   She was discharged home with prescriptions provided for Ozempic and her insulin.        Interval History  Seen in f/u while Dr. Kirtland Bouchard is on vacation. She has run out of her semglee and has not bee  able to use ozempic as she has not had an A1c in a while and insurance denied.     No recent labs done as labs were ordered to [facility]. She requests these [facility]    CURRENT MEDS  humalog 2-3 units TID AC  Semglee 15 units daily (last used 2 days ago)    Glucose has been 200-300s      ROS negative except for mentioned above.     Past Medical History;  No past medical history on file.    Family History:  Family History   Problem Relation Name Age of Onset    Cataract M Grandmother      Cataract M Grandfather      Cataract P Grandmother         Social History:  Social History     Socioeconomic History    Marital status: Single     Spouse name: Not on file    Number of children: Not on file    Years of education: Not on file    Highest education level: Not on file   Occupational History    Not on file    Tobacco Use    Smoking status: Never    Smokeless tobacco: Never   Substance and Sexual Activity    Alcohol use: Yes     Comment: occasionally    Drug use: Never    Sexual activity: Not on file   Other Topics Concern    Not on file   Social History Narrative    Not on file     Social Determinants of Health     Financial Resource Strain: Not on file   Food Insecurity: Not on file   Transportation Needs: Not on file   Physical Activity: Not on file   Stress: Not on file   Social Connections: Not on file   Intimate Partner Violence: Not on file   Housing Stability: Not on file   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 15 mL 11       Physical Exam:  deferred    Diagnostic Data:  No results found for: "NA", "K", "CL", "BICARB", "BUN", "CREAT", "GLU", "[place]"  No results found for: "BUN", "CREAT", "CL", "NA", "K", "[place]", "TBILI", "ALB", "TP", "AST", "ALK", "BICARB", "ALT", "GLU"  No results found for: "A1C"  No results found for: "TSH", "FREET4", "T3"  No results found for: "VITD25HYDROX", "VD2", "VD3", "VDT"  No results found for: "PTHINTACT"  No results found for: "GLUPOC"      Assessment and Plan:  Alexa Jones is a 27 year old female with a PMH significant for DM here as a follow up.    #DM  A1c 09/2021 10.6  - represcribed semglee 15 units daily  - c/w humalog 2-3 units TID AC  - resume ozempic- start 0.25 mg weekly x 4 weeks then 0.5 mg weekly. Advised not to take when pregnant. Check HCG- will need A1c before this is approved.     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 She  tried to get pregnant for 2 years. She has regular menses. She did not tolerate metformin. Discussed letrozole and clomiphene. She will be following up with her repro endo at [facility] soon.     Addendum 08/11/23  08/09/2023 A1c 10.8, glucose 246  HCG negative. Will complete PA for ozempic    RTC in 3 mths

## 2023-08-10 ENCOUNTER — Encounter: Payer: Self-pay | Admitting: Student in an Organized Health Care Education/Training Program

## 2023-08-10 LAB — HCG QUANTITATIVE, BLOOD: HCG, Total Quant: <5 m[IU]/mL

## 2023-08-10 LAB — GLUCOSE, BLOOD: Glucose, Plasma: 246 mg/dL — ABNORMAL HIGH (ref 65–99)

## 2023-08-10 LAB — C-PEPTIDE, BLOOD: C-Peptide: 2.21 ng/mL (ref 0.80–3.85)

## 2023-08-10 LAB — GLYCOSYLATED HGB(A1C), BLOOD: Hgb A1C: 10.8 %{Hb} — ABNORMAL HIGH (ref <5.7)

## 2023-08-11 ENCOUNTER — Encounter: Payer: Self-pay | Admitting: Student in an Organized Health Care Education/Training Program

## 2023-08-11 NOTE — Telephone Encounter (Signed)
Labs have been ordered and done.    Sahib Pella S MA.

## 2023-08-17 NOTE — Telephone Encounter (Signed)
Approval letter in media

## 2023-08-20 DIAGNOSIS — E1165 Type 2 diabetes mellitus with hyperglycemia: Secondary | ICD-10-CM

## 2023-08-20 MED ORDER — SEMAGLUTIDE(0.25 OR 0.5MG/DOS) 2 MG/3ML SC SOPN
PEN_INJECTOR | SUBCUTANEOUS | 3 refills | Status: DC
Start: 2023-08-20 — End: 2023-09-02

## 2023-09-02 ENCOUNTER — Encounter: Payer: Self-pay | Admitting: Student in an Organized Health Care Education/Training Program

## 2023-09-02 DIAGNOSIS — E1165 Type 2 diabetes mellitus with hyperglycemia: Secondary | ICD-10-CM

## 2023-09-07 MED ORDER — SEMAGLUTIDE(0.25 OR 0.5MG/DOS) 2 MG/3ML SC SOPN
PEN_INJECTOR | SUBCUTANEOUS | 3 refills | Status: DC
Start: 2023-09-07 — End: 2023-10-19

## 2023-10-19 ENCOUNTER — Other Ambulatory Visit: Payer: Self-pay | Admitting: Student in an Organized Health Care Education/Training Program

## 2023-10-19 DIAGNOSIS — E1165 Type 2 diabetes mellitus with hyperglycemia: Secondary | ICD-10-CM

## 2023-10-19 MED ORDER — SEMAGLUTIDE(0.25 OR 0.5MG/DOS) 2 MG/3ML SC SOPN
PEN_INJECTOR | SUBCUTANEOUS | 3 refills | Status: DC
Start: 2023-10-19 — End: 2023-11-11

## 2023-10-29 ENCOUNTER — Telehealth: Payer: Self-pay | Admitting: Student in an Organized Health Care Education/Training Program

## 2023-10-29 DIAGNOSIS — E1165 Type 2 diabetes mellitus with hyperglycemia: Secondary | ICD-10-CM

## 2023-11-11 ENCOUNTER — Ambulatory Visit
Payer: BC Managed Care – PPO | Attending: Student in an Organized Health Care Education/Training Program | Admitting: Student in an Organized Health Care Education/Training Program

## 2023-11-11 VITALS — BP 180/119 | HR 93 | Wt 179.5 lb

## 2023-11-11 DIAGNOSIS — Z6831 Body mass index (BMI) 31.0-31.9, adult: Secondary | ICD-10-CM | POA: Insufficient documentation

## 2023-11-11 DIAGNOSIS — I10 Essential (primary) hypertension: Secondary | ICD-10-CM | POA: Insufficient documentation

## 2023-11-11 DIAGNOSIS — F32A Depression, unspecified: Secondary | ICD-10-CM | POA: Insufficient documentation

## 2023-11-11 DIAGNOSIS — Z794 Long term (current) use of insulin: Secondary | ICD-10-CM | POA: Insufficient documentation

## 2023-11-11 DIAGNOSIS — E1165 Type 2 diabetes mellitus with hyperglycemia: Secondary | ICD-10-CM | POA: Insufficient documentation

## 2023-11-11 LAB — HEMOGLOBIN A1C, POINT OF CARE TESTING - ~~LOC~~: Hemoglobin A1C, POC: 10.2 % — ABNORMAL HIGH (ref 4.2–6.5)

## 2023-11-11 MED ORDER — NIFEDIPINE 30 MG OR TB24
30.0000 mg | ORAL_TABLET | Freq: Every day | ORAL | 3 refills | Status: AC
Start: 2023-11-11 — End: ?

## 2023-11-11 MED ORDER — SEMAGLUTIDE(0.25 OR 0.5MG/DOS) 2 MG/3ML SC SOPN
PEN_INJECTOR | SUBCUTANEOUS | 3 refills | Status: AC
Start: 2023-11-11 — End: ?

## 2023-11-11 MED ORDER — INSULIN PEN NEEDLE 32G X 4 MM MISC
11 refills | Status: DC
Start: 2023-11-11 — End: 2023-11-17

## 2023-11-11 NOTE — Progress Notes (Unsigned)
ENDOCRINOLOGY F/U NOTE    Date of Evaluation:  11/11/2023  LV: 08/06/2023    History of Present Illness:  Alexa Jones is a 28 year old female with a PMH significant for DM here for DM and PCOS f/u     PCOS   diagnosed at the age of 72  Used metformin but had GI side effects and stopped  Developed prediabetes and then diabetes over last 5-6 years      Interval History  Her BP is 180/118  Prior BP 162/100 at a prior appt  She has been going thru a lot of emotional stressors  She was planning a pregnancy. Saw repro endo at [facility] but they held off on starting clomiphene citrate or letrozole  due to elevated blood sugars and blood pressure. She is now using condoms for barrier protection as she wants to get healthier before conception  Regular periods.     CURRENT MEDS  humalog 4units TID AC  Semglee 15 units daily - missing doses  Ozempic was too expensive.     Glucose has been 200-300s      ROS negative except for mentioned above.     Past Medical History;  No past medical history on file.    Family History:  Family History   Problem Relation Name Age of Onset    Cataract M Grandmother      Cataract M Grandfather      Cataract P Grandmother         Social History:  Social History     Socioeconomic History    Marital status: Single     Spouse name: Not on file    Number of children: Not on file    Years of education: Not on file    Highest education level: Not on file   Occupational History    Not on file   Tobacco Use    Smoking status: Never    Smokeless tobacco: Never   Substance and Sexual Activity    Alcohol use: Yes     Comment: occasionally    Drug use: Never    Sexual activity: Not on file   Other Topics Concern    Not on file   Social History Narrative    Not on file     Social Determinants of Health     Financial Resource Strain: Not on file   Food Insecurity: Not on file   Transportation Needs: Not on file   Physical Activity: Not on file   Stress: Not on file   Social Connections: Not on file   Intimate  Partner Violence: Not on file   Housing Stability: Not on file   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 100 strip 3    insulin glargine 100 units/mL injection pen Inject 15 Units under the skin nightly. 15 mL 11    insulin lispro, 1 Unit Dial, (HUMALOG KWIKPEN) 100 units/mL Inject 4 Units under the skin 3 times daily (before meals). 15 mL 11    Insulin Pen Needles 32G X 4 MM MISC Use one pen needle with each  administration. 400 each 11    lancets 1 each by Other route 4 times daily (before meals and nightly). 100 Lancet. 3    semaglutide (OZEMPIC) 2 MG/3ML injection pen Start 0.25 mg weekly x 4 weeks then increase dose to 0.5 mg subcutaneous injection weekly 2 mL 3       Physical Exam:  deferred    Diagnostic Data:  Lab Results   Component Value Date    GLU 246 (H) 08/09/2023     Lab Results   Component Value Date    GLU 246 (H) 08/09/2023     Lab Results   Component Value Date    A1C 10.2 (H) 11/11/2023    A1C 10.8 (H) 08/09/2023     No results found for: "TSH", "FREET4", "T3"  No results found for: "VITD25HYDROX", "VD2", "VD3", "VDT"  No results found for: "PTHINTACT"  No results found for: "GLUPOC"      Assessment and Plan:  Alexa Jones is a 28 year old female with a PMH significant for DM here as a follow up.    #DM- uncontrolled.   A1c poc today is 10.2- similar to prior 10.8. Normal c-peptide.   - c/w semglee 15 units daily  - increase humalog to 8 u TID AC;  if the blood sugar is still elevated to >200 increase to 10 units; increase insulin by 2 u every few days  - start ozempic per pt preference- start 0.25 mg weekly x 4 weeks then 0.5 mg weekly. This was  too expensive despite insurance approval, but she will check cost with manufacturer coupon.  She was advised she should not get pregnant on ozempic. She is using protection and will continue to check HCG testing every 3 mths.   - stressed diet and exercise. She is actually studying Therapist, occupational course) nutrition so she did not want dietitian referral. There are some emotional stressors as well.   - did not tolerate metformin- GI s/e    # PCOS and concern for infertility- has tried in the past for a year  Following with [facility] endocrine but advised to improve BG and BP first before she could take letrozole. She did not tolerate metformin.    # HTN  BP today 180/118- similar at recent appt with repro endo  She has had elevated BP over last year although not this much- usually 130-140/90. She is under a lot of emotional stress  - start nifedipine 30 mg daily- as it is safe in pregnancy  - renin/ aldosterone    Depression/ anxiety  Sig contributing to hyperglycemia and HTN  - referral to psychiatry    RTC in 3 mths- w labs  Problem List Items Addressed This Visit    None  Visit Diagnoses       Type 2 diabetes mellitus with hyperglycemia, with long-term current use of insulin (CMS-HCC)    -  Primary    Relevant Orders    HGB A1C (POC) (Completed)    HCG Quantitative, Blood    Free Thyroxine, Blood    TSH, Blood    Comprehensive Metabolic Panel    Hemoglobin A1C    Random Urine Microalb/Creat Ratio Panel    HCG Quantitative, Blood    Follow Up in This Department - Endocrinology - [facility]    Type 2 diabetes mellitus with hyperglycemia, unspecified whether long term insulin use (CMS-HCC)        Relevant Medications    Insulin Pen Needles 32G X 4 MM  MISC    semaglutide (OZEMPIC) 2 MG/3ML injection pen    Depression, unspecified depression type        Relevant Orders    Consult/Referral to Psychiatry    Follow Up in This Department - Endocrinology - [facility]    Essential (primary) hypertension        Relevant Medications     NIFEdipine (ADALAT CC) 30 MG Controlled-Release tablet    Other Relevant Orders    Follow Up in This Department - Endocrinology - [facility]    Renin Activity    Aldosterone, Blood              Nancy Marus, MD  Endocrinology

## 2023-11-17 ENCOUNTER — Encounter: Payer: Self-pay | Admitting: Student in an Organized Health Care Education/Training Program

## 2023-11-17 DIAGNOSIS — E1165 Type 2 diabetes mellitus with hyperglycemia: Secondary | ICD-10-CM

## 2023-11-28 MED ORDER — INSULIN PEN NEEDLE 32G X 4 MM MISC
11 refills | Status: AC
Start: 2023-11-28 — End: ?

## 2024-01-31 ENCOUNTER — Ambulatory Visit: Attending: Internal Medicine | Admitting: Internal Medicine

## 2024-01-31 ENCOUNTER — Encounter: Payer: Self-pay | Admitting: Internal Medicine

## 2024-01-31 DIAGNOSIS — H0289 Other specified disorders of eyelid: Secondary | ICD-10-CM | POA: Insufficient documentation

## 2024-01-31 DIAGNOSIS — H04123 Dry eye syndrome of bilateral lacrimal glands: Secondary | ICD-10-CM | POA: Insufficient documentation

## 2024-01-31 DIAGNOSIS — H02886 Meibomian gland dysfunction of left eye, unspecified eyelid: Secondary | ICD-10-CM | POA: Insufficient documentation

## 2024-01-31 DIAGNOSIS — H02883 Meibomian gland dysfunction of right eye, unspecified eyelid: Secondary | ICD-10-CM | POA: Insufficient documentation

## 2024-01-31 DIAGNOSIS — H02844 Edema of left upper eyelid: Secondary | ICD-10-CM | POA: Insufficient documentation

## 2024-01-31 DIAGNOSIS — H0014 Chalazion left upper eyelid: Secondary | ICD-10-CM | POA: Insufficient documentation

## 2024-01-31 NOTE — Progress Notes (Signed)
 Clinic Note    ICD-10-CM ICD-9-CM   1. Chalazion of left upper eyelid  H00.14 373.2   2. Pain and swelling of upper eyelid of left eye  H02.89 379.91    H02.844 379.92   3. 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 No madarosis or ulceration.    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  -I saw and examined the patient. The final examination findings, image interpretations, and plan as documented in the record represent my personal judgment and conclusions

## 2024-03-20 ENCOUNTER — Ambulatory Visit: Payer: BC Managed Care – PPO | Admitting: Student in an Organized Health Care Education/Training Program
